# Patient Record
Sex: Male | Born: 1970
Health system: Southern US, Community
[De-identification: ages and names within clinical notes are randomized; demographics above are authoritative.]

## PROBLEM LIST (undated history)

## (undated) DIAGNOSIS — E785 Hyperlipidemia, unspecified: Secondary | ICD-10-CM

## (undated) DIAGNOSIS — I10 Essential (primary) hypertension: Secondary | ICD-10-CM

## (undated) DIAGNOSIS — K219 Gastro-esophageal reflux disease without esophagitis: Secondary | ICD-10-CM

## (undated) HISTORY — DX: Gastro-esophageal reflux disease without esophagitis: K21.9

## (undated) HISTORY — DX: Hyperlipidemia, unspecified: E78.5

## (undated) HISTORY — DX: Essential (primary) hypertension: I10

## (undated) HISTORY — PX: SPINE SURGERY: SHX786

---

## 2006-05-05 ENCOUNTER — Emergency Department: Payer: Self-pay | Admitting: Emergency Medicine

## 2006-05-05 ENCOUNTER — Other Ambulatory Visit: Payer: Self-pay

## 2006-11-02 ENCOUNTER — Encounter: Payer: Self-pay | Admitting: Family Medicine

## 2007-02-17 HISTORY — PX: BACK SURGERY: SHX140

## 2007-06-13 ENCOUNTER — Encounter: Payer: Self-pay | Admitting: Family Medicine

## 2007-06-21 ENCOUNTER — Encounter: Payer: Self-pay | Admitting: Family Medicine

## 2007-06-23 ENCOUNTER — Encounter: Payer: Self-pay | Admitting: Family Medicine

## 2008-02-15 ENCOUNTER — Encounter: Payer: Self-pay | Admitting: Family Medicine

## 2008-09-12 ENCOUNTER — Encounter: Payer: Self-pay | Admitting: Family Medicine

## 2009-07-09 ENCOUNTER — Emergency Department (HOSPITAL_COMMUNITY): Admission: EM | Admit: 2009-07-09 | Discharge: 2009-07-09 | Payer: Self-pay | Admitting: Emergency Medicine

## 2009-07-12 ENCOUNTER — Encounter: Payer: Self-pay | Admitting: Family Medicine

## 2010-01-21 ENCOUNTER — Ambulatory Visit: Payer: Self-pay | Admitting: Family Medicine

## 2010-01-21 DIAGNOSIS — I10 Essential (primary) hypertension: Secondary | ICD-10-CM | POA: Insufficient documentation

## 2010-01-21 DIAGNOSIS — E1165 Type 2 diabetes mellitus with hyperglycemia: Secondary | ICD-10-CM | POA: Insufficient documentation

## 2010-01-21 DIAGNOSIS — K219 Gastro-esophageal reflux disease without esophagitis: Secondary | ICD-10-CM | POA: Insufficient documentation

## 2010-01-21 DIAGNOSIS — E785 Hyperlipidemia, unspecified: Secondary | ICD-10-CM | POA: Insufficient documentation

## 2010-01-21 DIAGNOSIS — E1129 Type 2 diabetes mellitus with other diabetic kidney complication: Secondary | ICD-10-CM | POA: Insufficient documentation

## 2010-01-21 LAB — HM DIABETES FOOT EXAM

## 2010-01-23 ENCOUNTER — Encounter: Payer: Self-pay | Admitting: Family Medicine

## 2010-01-23 LAB — CONVERTED CEMR LAB
ALT: 22 units/L (ref 0–53)
AST: 18 units/L (ref 0–37)
Albumin: 4.7 g/dL (ref 3.5–5.2)
Alkaline Phosphatase: 71 units/L (ref 39–117)
BUN: 14 mg/dL (ref 6–23)
Bilirubin, Direct: 0.1 mg/dL (ref 0.0–0.3)
CO2: 29 meq/L (ref 19–32)
Calcium: 10 mg/dL (ref 8.4–10.5)
Chloride: 97 meq/L (ref 96–112)
Cholesterol: 211 mg/dL — ABNORMAL HIGH (ref 0–200)
Creatinine, Ser: 0.9 mg/dL (ref 0.4–1.5)
Direct LDL: 119.4 mg/dL
GFR calc non Af Amer: 94.99 mL/min (ref >60.00)
Glucose, Bld: 227 mg/dL — ABNORMAL HIGH (ref 70–99)
HDL: 38.5 mg/dL — ABNORMAL LOW (ref >39.00)
Hgb A1c MFr Bld: 8.5 % — ABNORMAL HIGH (ref 4.6–6.5)
Potassium: 4.6 meq/L (ref 3.5–5.1)
Sodium: 137 meq/L (ref 135–145)
Total Bilirubin: 0.6 mg/dL (ref 0.3–1.2)
Total CHOL/HDL Ratio: 5
Total Protein: 8 g/dL (ref 6.0–8.3)
Triglycerides: 299 mg/dL — ABNORMAL HIGH (ref 0.0–149.0)
VLDL: 59.8 mg/dL — ABNORMAL HIGH (ref 0.0–40.0)

## 2010-02-03 ENCOUNTER — Encounter: Payer: Self-pay | Admitting: Family Medicine

## 2010-02-03 LAB — CONVERTED CEMR LAB
Cholesterol: 208 mg/dL
Creatinine, Ser: 0.8 mg/dL
Direct LDL: 113 mg/dL
HDL: 42 mg/dL
Hgb A1c MFr Bld: 7.4 %
Total CHOL/HDL Ratio: 4.95
Triglycerides: 278 mg/dL

## 2010-02-12 ENCOUNTER — Encounter: Payer: Self-pay | Admitting: Family Medicine

## 2010-03-18 NOTE — Miscellaneous (Signed)
  Clinical Lists Changes  Medications: Changed medication from METFORMIN HCL 500 MG TABS (METFORMIN HCL) Take 1 tablet by mouth once daily to METFORMIN HCL 500 MG TABS (METFORMIN HCL) Take 1 tablet by mouth two times a day for 1 week, then 2 tablets two times a day. - Signed Rx of METFORMIN HCL 500 MG TABS (METFORMIN HCL) Take 1 tablet by mouth two times a day for 1 week, then 2 tablets two times a day.;  #120 x 3;  Signed;  Entered by: Delilah Shan CMA (AAMA);  Authorized by: Crawford Givens MD;  Method used: Electronically to [facility] [phone]*, [address] [place], [place]  [address], Ph: [phone], Fax:    Prescriptions: METFORMIN HCL 500 MG TABS (METFORMIN HCL) Take 1 tablet by mouth two times a day for 1 week, then 2 tablets two times a day.  #120 x 3   Entered by:   Delilah Shan CMA (AAMA)   Authorized by:   Crawford Givens MD   Signed by:   Delilah Shan CMA (AAMA) on 01/23/2010   Method used:   Electronically to        [facility] [phone]* (retail)       [address]       [place], [place]  [address]       Ph: [phone]       Fax:    RxID:   [id number]

## 2010-03-18 NOTE — Assessment & Plan Note (Signed)
Summary: TRANSFER FROM [facility]   Vital Signs:  Patient profile:   40 year old male Height:      71.75 inches Weight:      220.25 pounds BMI:     30.19 Temp:     98.4 degrees F oral Pulse rate:   80 / minute Pulse rhythm:   regular BP sitting:   128 / 82  (left arm) Cuff size:   large  Vitals Entered By: Delilah Shan CMA Duncan Dull) (January 21, 2010 9:07 AM) CC: Transfer from [place]   History of Present Illness: Diabetes:  Using medications without difficulties:yes Hypoglycemic episodes:not checked Hyperglycemic episodes:not checked Feet problems:no Blood Sugars averaging:not checked eye exam within last year: not yet, enouraged  Elevated Cholesterol: Using medications without problems:yes Muscle aches: no Other complaints:no  Hypertension:      Using medication without problems or lightheadedness: yes Chest pain with exertion:no Edema:no Short of breath:no Average home BPs:not checked Other issues: no  due for labs.   Preventive Screening-Counseling & Management  Alcohol-Tobacco     Smoking Status: never  Caffeine-Diet-Exercise     Does Patient Exercise: yes      Drug Use:  no.    Current Medications (verified): 1)  Lisinopril 20 Mg Tabs (Lisinopril) .... Take 1 Tablet By Mouth Once A Day 2)  Omeprazole 20 Mg Cpdr (Omeprazole) .... Take 1 Tablet By Mouth Once A Day 3)  Metformin Hcl 500 Mg Tabs (Metformin Hcl) .... Take 1 Tablet By Mouth Once Daily 4)  Simvastatin 40 Mg Tabs (Simvastatin) .... Take 1 Tab By Mouth At Bedtime  Allergies (verified): No Known Drug Allergies  Past History:  Past Medical History: HYPERTENSION (ICD-401.9) HYPERLIPIDEMIA (ICD-272.4) GERD (ICD-530.81) DIABETES MELLITUS, TYPE II (ICD-250.00)    Past Surgical History: 2009  Back surgery - herniated disk  Family History: Reviewed history and no changes required. Family History Diabetes 1st degree relative, parents Family History High cholesterol,  parents Family History Hypertension, parents F  alive, DM2 M alive HTN, DM2, chol 2  brothers alive  Social History: Reviewed history and no changes required. Occupation:  Oncologist since 1992 Education:  [employer], [employer] Never Smoked Alcohol use-yes, rare Drug use-no Regular exercise-yes, walks postal route married 1996, 1 daughterOccupation:  employed Smoking Status:  never Drug Use:  no Does Patient Exercise:  yes  Review of Systems       See HPI.  Otherwise negative.    Physical Exam  General:  GEN: nad, alert and oriented HEENT: mucous membranes moist NECK: supple w/o LA CV: rrr.  no murmur PULM: ctab, no inc wob ABD: soft, +bs EXT: no edema SKIN: no acute rash   Diabetes Management Exam:    Foot Exam (with socks and/or shoes not present):       Sensory-Pinprick/Light touch:          Left medial foot (L-4): normal          Left dorsal foot (L-5): normal          Left lateral foot (S-1): normal          Right medial foot (L-4): normal          Right dorsal foot (L-5): normal          Right lateral foot (S-1): normal       Sensory-Monofilament:          Left foot: normal          Right foot: normal  Inspection:          Left foot: normal          Right foot: normal       Nails:          Left foot: normal          Right foot: normal   Impression & Recommendations:  Problem # 1:  HYPERTENSION (ICD-401.9) Controlled, no change in meds.  Check labs.  His updated medication list for this problem includes:    Lisinopril 20 Mg Tabs (Lisinopril) .Marland Kitchen... Take 1 tablet by mouth once a day  Problem # 2:  HYPERLIPIDEMIA (ICD-272.4) See notes on labs.  His updated medication list for this problem includes:    Simvastatin 40 Mg Tabs (Simvastatin) .Marland Kitchen... Take 1 tab by mouth at bedtime  Problem # 3:  DIABETES MELLITUS, TYPE II (ICD-250.00) See notes on labs.  We talked about diet, exercise and weight.  His updated medication list for this  problem includes:    Lisinopril 20 Mg Tabs (Lisinopril) .Marland Kitchen... Take 1 tablet by mouth once a day    Metformin Hcl 500 Mg Tabs (Metformin hcl) .Marland Kitchen... Take 1 tablet by mouth once daily  Orders: TLB-BMP (Basic Metabolic Panel-BMET) (80048-METABOL) TLB-Hepatic/Liver Function Pnl (80076-HEPATIC) TLB-Lipid Panel (80061-LIPID) TLB-A1C / Hgb A1C (Glycohemoglobin) (83036-A1C)  Complete Medication List: 1)  Lisinopril 20 Mg Tabs (Lisinopril) .... Take 1 tablet by mouth once a day 2)  Omeprazole 20 Mg Cpdr (Omeprazole) .... Take 1 tablet by mouth once a day 3)  Metformin Hcl 500 Mg Tabs (Metformin hcl) .... Take 1 tablet by mouth once daily 4)  Simvastatin 40 Mg Tabs (Simvastatin) .... Take 1 tab by mouth at bedtime  Patient Instructions: 1)  You can get your results through our phone system.  Follow the instructions on the blue card.  2)  Don't change your meds in the meantime.  Keep working on your weight.  3)  I want you to come back in 6 months for a 30 min appointment with A1c ahead of time (250.00). 4)  Take care.  Glad to see you today.  Prescriptions: SIMVASTATIN 40 MG TABS (SIMVASTATIN) Take 1 tab by mouth at bedtime  #90 x 3   Entered and Authorized by:   Crawford Givens MD   Signed by:   Crawford Givens MD on 01/21/2010   Method used:   Electronically to        [facility] [phone]* (retail)       [address]       [place], [place]  [address]       Ph: [phone]       Fax:    RxID:   [id number] METFORMIN HCL 500 MG TABS (METFORMIN HCL) Take 1 tablet by mouth once daily  #90 x 3   Entered and Authorized by:   Crawford Givens MD   Signed by:   Crawford Givens MD on 01/21/2010   Method used:   Electronically to        [facility] [phone]* (retail)       [address]       [place], [place]  [address]       Ph: [phone]       Fax:    RxID:   [id number] LISINOPRIL 20 MG TABS (LISINOPRIL) Take 1 tablet by mouth once a day  #90 x 3   Entered and Authorized by:   Crawford Givens MD    Signed by:   Cheree Ditto  Para March MD on 01/21/2010   Method used:   Electronically to        [facility] [phone]* (retail)       [address]       [place], [place]  [address]       Ph: [phone]       Fax:    RxID:   [id number]    Orders Added: 1)  Est. Patient Level IV [62952] 2)  TLB-BMP (Basic Metabolic Panel-BMET) [80048-METABOL] 3)  TLB-Hepatic/Liver Function Pnl [80076-HEPATIC] 4)  TLB-Lipid Panel [80061-LIPID] 5)  TLB-A1C / Hgb A1C (Glycohemoglobin) [83036-A1C]    Current Allergies (reviewed today): No known allergies  Appended Document: TRANSFER FROM [facility] Flu Vaccine Consent Questions     Do you have a history of severe allergic reactions to this vaccine? no    Any prior history of allergic reactions to egg and/or gelatin? no    Do you have a sensitivity to the preservative Thimersol? no    Do you have a past history of Guillan-Barre Syndrome? no    Do you currently have an acute febrile illness? no    Have you ever had a severe reaction to latex? no    Vaccine information given and explained to patient? yes    Are you currently pregnant? no    Lot Number:[id number]   Exp Date:08/16/2010   Site Given  Left Deltoid IM Lugene Fuquay CMA (AAMA)  January 21, 2010 10:50 AM

## 2010-03-20 LAB — CONVERTED CEMR LAB
ALT: 20 units/L
AST: 18 units/L
Albumin: 4.2 g/dL
Alkaline Phosphatase: 60 units/L
BUN: 12 mg/dL
Calcium: 9.5 mg/dL
Chloride: 101 meq/L
Cholesterol: 208 mg/dL
Creatinine, Ser: 0.8 mg/dL
Direct LDL: 113 mg/dL
Glucose, Bld: 138 mg/dL
HDL: 42 mg/dL
Hemoglobin: 17.4 g/dL
Hgb A1c MFr Bld: 6.5 %
Potassium: 4.5 meq/L
Sodium: 137 meq/L
Total Bilirubin: 0.7 mg/dL
Total CHOL/HDL Ratio: 4.95
Total Protein: 6.7 g/dL
Triglycerides: 278 mg/dL

## 2010-03-20 NOTE — Miscellaneous (Signed)
  Clinical Lists Changes  Observations: Added new observation of HGB: 17.4 g/dL (04/54/0981 19:14) Added new observation of HGBA1C: 6.5 % (02/15/2008 16:48) Added new observation of CALCIUM: 9.5 mg/dL (78/29/5621 30:86) Added new observation of ALBUMIN: 4.2 g/dL (57/84/6962 95:28) Added new observation of PROTEIN, TOT: 6.7 g/dL (41/32/4401 02:72) Added new observation of SGPT (ALT): 20 units/L (02/15/2008 16:48) Added new observation of SGOT (AST): 18 units/L (02/15/2008 16:48) Added new observation of ALK PHOS: 60 units/L (02/15/2008 16:48) Added new observation of BILI TOTAL: 0.7 mg/dL (53/66/4403 47:42) Added new observation of CREATININE: 0.80 mg/dL (59/56/3875 64:33) Added new observation of BUN: 12 mg/dL (29/51/8841 66:06) Added new observation of BG RANDOM: 138 mg/dL (30/16/0109 32:35) Added new observation of CL SERUM: 101 meq/L (02/15/2008 16:48) Added new observation of K SERUM: 4.5 meq/L (02/15/2008 16:48) Added new observation of NA: 137 meq/L (02/15/2008 16:48) Added new observation of LDL DIR: 113 mg/dL (57/32/2025 42:70) Added new observation of CHOL/HDL: 4.95  (02/15/2008 16:48) Added new observation of HDL: 42 mg/dL (62/37/6283 15:17) Added new observation of TRIGLYC TOT: 278 mg/dL (61/60/7371 06:26) Added new observation of CHOLESTEROL: 208 mg/dL (94/85/4627 03:50)

## 2010-03-20 NOTE — Letter (Signed)
Summary: Deboraha Sprang @ [facility] @ [employer]   Imported By: Lanelle Bal 02/11/2010 11:45:11  _____________________________________________________________________  External Attachment:    Type:   Image     Comment:   External Document

## 2010-03-20 NOTE — Letter (Signed)
Summary: Deboraha Sprang @ [facility] @ [employer]   Imported By: Lanelle Bal 02/11/2010 11:47:41  _____________________________________________________________________  External Attachment:    Type:   Image     Comment:   External Document

## 2010-03-20 NOTE — Letter (Signed)
Summary: Harry Whitaker @ [facility] @ [employer]   Imported By: Lanelle Bal 02/11/2010 11:43:26  _____________________________________________________________________  External Attachment:    Type:   Image     Comment:   External Document

## 2010-03-20 NOTE — Letter (Signed)
Summary: Harry Whitaker @ [facility] @ [employer]   Imported By: Lanelle Bal 02/11/2010 11:46:10  _____________________________________________________________________  External Attachment:    Type:   Image     Comment:   External Document

## 2010-03-20 NOTE — Miscellaneous (Signed)
Summary: Preload  Clinical Lists Changes  Observations: Added new observation of CHOL/HDL: 4.95  (02/03/2010 10:47) Added new observation of LDL DIR: 113 mg/dL (16/11/9602 54:09) Added new observation of HDL: 42 mg/dL (81/19/1478 29:56) Added new observation of TRIGLYC TOT: 278 mg/dL (21/30/8657 84:69) Added new observation of CHOLESTEROL: 208 mg/dL (62/95/2841 32:44) Added new observation of CREATININE: 0.80 mg/dL (02/18/7251 66:44) Added new observation of HGBA1C: 7.4 % (02/03/2010 10:47)

## 2010-03-20 NOTE — Letter (Signed)
Summary: Harry Whitaker @ [facility] @ [employer]   Imported By: Harry Whitaker 02/11/2010 11:44:26  _____________________________________________________________________  External Attachment:    Type:   Image     Comment:   External Document

## 2010-03-20 NOTE — Letter (Signed)
Summary: Harry Whitaker @ [facility] @ [employer]   Imported By: Lanelle Bal 02/11/2010 11:46:58  _____________________________________________________________________  External Attachment:    Type:   Image     Comment:   External Document

## 2010-04-30 ENCOUNTER — Other Ambulatory Visit: Payer: Self-pay | Admitting: Family Medicine

## 2010-04-30 ENCOUNTER — Other Ambulatory Visit (INDEPENDENT_AMBULATORY_CARE_PROVIDER_SITE_OTHER): Payer: Federal, State, Local not specified - PPO

## 2010-04-30 ENCOUNTER — Encounter: Payer: Self-pay | Admitting: *Deleted

## 2010-04-30 DIAGNOSIS — E119 Type 2 diabetes mellitus without complications: Secondary | ICD-10-CM

## 2010-04-30 LAB — HEMOGLOBIN A1C: Hgb A1c MFr Bld: 8.3 % — ABNORMAL HIGH (ref 4.6–6.5)

## 2010-05-08 ENCOUNTER — Encounter: Payer: Self-pay | Admitting: Family Medicine

## 2010-05-09 ENCOUNTER — Encounter: Payer: Self-pay | Admitting: Family Medicine

## 2010-05-09 ENCOUNTER — Ambulatory Visit (INDEPENDENT_AMBULATORY_CARE_PROVIDER_SITE_OTHER): Payer: Federal, State, Local not specified - PPO | Admitting: Family Medicine

## 2010-05-09 VITALS — BP 122/78 | HR 80 | Temp 98.2°F | Ht 72.0 in | Wt 223.1 lb

## 2010-05-09 DIAGNOSIS — E119 Type 2 diabetes mellitus without complications: Secondary | ICD-10-CM

## 2010-05-09 NOTE — Patient Instructions (Addendum)
Increase the metformin to 4 tabs a day.  Let me know if you have GI upset on the medicine.   Schedule a follow up appointment in: 3 months (a few days after your labs are done).  Schedule both on the way out today.  Work on [employer] and keep exercising.  Take care.

## 2010-05-09 NOTE — Assessment & Plan Note (Signed)
Inc metformin to 2 po bid and recheck A1c in 3 months with fu OV a few days later.  D/w pt ZO:XWRU and exercise.  He declined cards work up at this point.  I asked him to consider this.  No CP.

## 2010-05-09 NOTE — Progress Notes (Signed)
Diabetes:  Using medications without difficulties:yes but only taking the metformin 2 at night, not bid Hypoglycemic episodes: Not checked but no symptoms.  Hyperglycemic episodes:Not checked but no symptoms.  Feet problems:no Blood Sugars averaging: Not checked  PMH and SH reviewed  Meds, vitals, and allergies reviewed.   ROS: See HPI.  Otherwise negative.    GEN: nad, alert and oriented HEENT: mucous membranes moist NECK: supple w/o LA CV: rrr. PULM: ctab, no inc wob ABD: soft, +bs EXT: no edema SKIN: no acute rash  Diabetic foot exam: Normal inspection No skin breakdown No calluses  Normal DP pulses Normal sensation to light tough and monofilament Nails normal

## 2010-08-01 ENCOUNTER — Other Ambulatory Visit: Payer: Federal, State, Local not specified - PPO

## 2010-08-11 ENCOUNTER — Ambulatory Visit: Payer: Federal, State, Local not specified - PPO | Admitting: Family Medicine

## 2010-08-11 DIAGNOSIS — Z0289 Encounter for other administrative examinations: Secondary | ICD-10-CM

## 2010-09-03 ENCOUNTER — Other Ambulatory Visit: Payer: Self-pay | Admitting: *Deleted

## 2010-09-03 MED ORDER — METFORMIN HCL 500 MG PO TABS: ORAL_TABLET | ORAL | Status: DC

## 2010-10-23 ENCOUNTER — Other Ambulatory Visit (INDEPENDENT_AMBULATORY_CARE_PROVIDER_SITE_OTHER): Payer: Federal, State, Local not specified - PPO

## 2010-10-23 ENCOUNTER — Telehealth: Payer: Self-pay | Admitting: *Deleted

## 2010-10-23 DIAGNOSIS — E119 Type 2 diabetes mellitus without complications: Secondary | ICD-10-CM

## 2010-10-23 LAB — HEMOGLOBIN A1C: Hgb A1c MFr Bld: 7.7 % — ABNORMAL HIGH (ref 4.6–6.5)

## 2010-10-23 NOTE — Telephone Encounter (Signed)
Chart opened in error

## 2010-10-24 ENCOUNTER — Other Ambulatory Visit: Payer: Federal, State, Local not specified - PPO

## 2010-10-29 ENCOUNTER — Ambulatory Visit (INDEPENDENT_AMBULATORY_CARE_PROVIDER_SITE_OTHER): Payer: Federal, State, Local not specified - PPO | Admitting: Family Medicine

## 2010-10-29 ENCOUNTER — Encounter: Payer: Self-pay | Admitting: Family Medicine

## 2010-10-29 VITALS — BP 136/68 | HR 92 | Temp 97.8°F | Wt 223.0 lb

## 2010-10-29 DIAGNOSIS — E119 Type 2 diabetes mellitus without complications: Secondary | ICD-10-CM

## 2010-10-29 MED ORDER — SIMVASTATIN 40 MG PO TABS: 40.0000 mg | ORAL_TABLET | Freq: Every day | ORAL | Status: DC

## 2010-10-29 MED ORDER — LISINOPRIL 20 MG PO TABS: 20.0000 mg | ORAL_TABLET | Freq: Every day | ORAL | Status: DC

## 2010-10-29 MED ORDER — OMEPRAZOLE 20 MG PO CPDR: 20.0000 mg | DELAYED_RELEASE_CAPSULE | Freq: Every day | ORAL | Status: DC

## 2010-10-29 MED ORDER — METFORMIN HCL 500 MG PO TABS: ORAL_TABLET | ORAL | Status: DC

## 2010-10-29 NOTE — Progress Notes (Signed)
Diabetes:  Using medications without difficulties: max 2 metformin a day Hypoglycemic episodes:no Hyperglycemic episodes:no Feet problems: callus now on the new route Blood Sugars averaging: not checked A1c is some better at 7.7 He cut out soda, now on sugar free drinks.  "I cut back a little bit." New route at work, more walking.  Started 2 days ago.    PMH and SH reviewed  Meds, vitals, and allergies reviewed.   ROS: See HPI.  Otherwise negative.    GEN: nad, alert and oriented HEENT: mucous membranes moist NECK: supple w/o LA CV: rrr. PULM: ctab, no inc wob ABD: soft, +bs EXT: no edema SKIN: no acute rash  Diabetic foot exam: Normal inspection No skin breakdown Callus on R 4th toe Normal DP pulses Normal sensation to light touch and monofilament Nails normal

## 2010-10-29 NOTE — Patient Instructions (Addendum)
I would get a flu shot each fall.   Recheck fasting labs in 6 months before a OV.  Take care.

## 2010-10-30 ENCOUNTER — Encounter: Payer: Self-pay | Admitting: Family Medicine

## 2010-10-30 NOTE — Assessment & Plan Note (Signed)
He'll work on weight and get comfortable shoes.  I wrote an rx for this, given the callus.  Rx written for meter, in case he can't get his to work.  His DM is still uncontrolled, but improved from prev.  D/w pt about this.  Recheck 6 months.  He agrees.  No sign of infection at the callus site.

## 2011-01-28 ENCOUNTER — Other Ambulatory Visit: Payer: Self-pay | Admitting: Family Medicine

## 2011-01-28 NOTE — Telephone Encounter (Signed)
Patient was seen 10/29/10 but notation on medication refill request says he must have an OV for further refills.  Please advise.

## 2011-01-28 NOTE — Telephone Encounter (Signed)
rx sent, sig adjusted.

## 2011-03-27 ENCOUNTER — Other Ambulatory Visit: Payer: Self-pay | Admitting: Family Medicine

## 2011-12-19 ENCOUNTER — Other Ambulatory Visit: Payer: Self-pay | Admitting: Family Medicine

## 2011-12-21 ENCOUNTER — Other Ambulatory Visit: Payer: Self-pay | Admitting: Family Medicine

## 2011-12-30 ENCOUNTER — Other Ambulatory Visit: Payer: Self-pay | Admitting: Family Medicine

## 2012-01-05 ENCOUNTER — Ambulatory Visit (INDEPENDENT_AMBULATORY_CARE_PROVIDER_SITE_OTHER): Payer: Federal, State, Local not specified - PPO | Admitting: Family Medicine

## 2012-01-05 ENCOUNTER — Encounter: Payer: Self-pay | Admitting: Family Medicine

## 2012-01-05 VITALS — BP 122/80 | HR 98 | Temp 98.0°F | Wt 216.0 lb

## 2012-01-05 DIAGNOSIS — Z0000 Encounter for general adult medical examination without abnormal findings: Secondary | ICD-10-CM

## 2012-01-05 DIAGNOSIS — E119 Type 2 diabetes mellitus without complications: Secondary | ICD-10-CM

## 2012-01-05 DIAGNOSIS — E785 Hyperlipidemia, unspecified: Secondary | ICD-10-CM

## 2012-01-05 DIAGNOSIS — I10 Essential (primary) hypertension: Secondary | ICD-10-CM

## 2012-01-05 LAB — LDL CHOLESTEROL, DIRECT: Direct LDL: 145.4 mg/dL

## 2012-01-05 LAB — COMPREHENSIVE METABOLIC PANEL
ALT: 27 U/L (ref 0–53)
AST: 17 U/L (ref 0–37)
Albumin: 4.6 g/dL (ref 3.5–5.2)
Alkaline Phosphatase: 74 U/L (ref 39–117)
BUN: 18 mg/dL (ref 6–23)
CO2: 28 mEq/L (ref 19–32)
Calcium: 9.6 mg/dL (ref 8.4–10.5)
Chloride: 94 mEq/L — ABNORMAL LOW (ref 96–112)
Creatinine, Ser: 1 mg/dL (ref 0.4–1.5)
GFR: 82.72 mL/min (ref >60.00)
Glucose, Bld: 287 mg/dL — ABNORMAL HIGH (ref 70–99)
Potassium: 4.5 mEq/L (ref 3.5–5.1)
Sodium: 132 mEq/L — ABNORMAL LOW (ref 135–145)
Total Bilirubin: 0.6 mg/dL (ref 0.3–1.2)
Total Protein: 8.3 g/dL (ref 6.0–8.3)

## 2012-01-05 LAB — LIPID PANEL
Cholesterol: 265 mg/dL — ABNORMAL HIGH (ref 0–200)
HDL: 41.6 mg/dL (ref >39.00)
Total CHOL/HDL Ratio: 6
Triglycerides: 488 mg/dL — ABNORMAL HIGH (ref 0.0–149.0)
VLDL: 97.6 mg/dL — ABNORMAL HIGH (ref 0.0–40.0)

## 2012-01-05 LAB — HEMOGLOBIN A1C: Hgb A1c MFr Bld: 9 % — ABNORMAL HIGH (ref 4.6–6.5)

## 2012-01-05 MED ORDER — LISINOPRIL 20 MG PO TABS: 20.0000 mg | ORAL_TABLET | Freq: Every day | ORAL | Status: DC

## 2012-01-05 MED ORDER — OMEPRAZOLE 20 MG PO CPDR: 20.0000 mg | DELAYED_RELEASE_CAPSULE | Freq: Every day | ORAL | Status: DC

## 2012-01-05 MED ORDER — METFORMIN HCL 1000 MG PO TABS: 1000.0000 mg | ORAL_TABLET | Freq: Every day | ORAL | Status: DC

## 2012-01-05 MED ORDER — SIMVASTATIN 40 MG PO TABS: 40.0000 mg | ORAL_TABLET | Freq: Every day | ORAL | Status: DC

## 2012-01-05 NOTE — Patient Instructions (Addendum)
Go to the lab on the way out.  We'll contact you with your lab report.  Don't change your meds for now.  Keep working on your weight with diet and exercise.  Take care.   Recheck in 6 months.

## 2012-01-05 NOTE — Progress Notes (Signed)
CPE- See plan.  Routine anticipatory guidance given to patient.  See health maintenance. Tetanus 2011 Flu yearly, done at work Colon and prostate cancer screening not due.  Exercise discussed. Walking some.   Living will discussed.  Wife would be designated if incapacitated.    Diabetes:  Using medications without difficulties: yes, if 1000mg  or less of metformin Hypoglycemic episodes:no symptoms Hyperglycemic episodes:no symptoms Feet problems: no Blood Sugars averaging: not checked eye exam within last year: d/w pt, encouraged f/u.   Hypertension:    Using medication without problems or lightheadedness: yes Chest pain with exertion:no Edema:no Short of breath:no  Elevated Cholesterol: Using medications without problems:yes Muscle aches: no Diet compliance: yes Exercise: yes  PMH and SH reviewed.   Vital signs, Meds and allergies reviewed.  ROS: See HPI.  Otherwise nontributory.   GEN: nad, alert and oriented HEENT: mucous membranes moist NECK: supple w/o LA CV: rrr.  no murmur PULM: ctab, no inc wob ABD: soft, +bs EXT: no edema SKIN: no acute rash  Diabetic foot exam: Normal inspection No skin breakdown No calluses  Normal DP pulses Normal sensation to light tough and monofilament Nails normal

## 2012-01-07 DIAGNOSIS — Z0000 Encounter for general adult medical examination without abnormal findings: Secondary | ICD-10-CM | POA: Insufficient documentation

## 2012-01-07 MED ORDER — PIOGLITAZONE HCL 15 MG PO TABS: 15.0000 mg | ORAL_TABLET | Freq: Every day | ORAL | Status: DC

## 2012-01-07 NOTE — Assessment & Plan Note (Signed)
Elevated, continue statin and work on DM2 control first.  Recheck in 3 months.

## 2012-01-07 NOTE — Assessment & Plan Note (Signed)
Controlled, continue as is.  

## 2012-01-07 NOTE — Assessment & Plan Note (Signed)
Elevated A1c, would add on actos and have pt recheck A1c in 3 months before OV.

## 2012-01-07 NOTE — Assessment & Plan Note (Signed)
Routine anticipatory guidance given to patient.  See health maintenance. Tetanus 2011 Flu yearly, done at work Colon and prostate cancer screening not due.  Exercise discussed. Walking some.   Living will discussed.  Wife would be designated if incapacitated.

## 2012-01-12 ENCOUNTER — Encounter: Payer: Self-pay | Admitting: *Deleted

## 2012-01-22 ENCOUNTER — Other Ambulatory Visit: Payer: Self-pay | Admitting: Family Medicine

## 2012-04-06 ENCOUNTER — Other Ambulatory Visit: Payer: Federal, State, Local not specified - PPO

## 2012-04-12 ENCOUNTER — Other Ambulatory Visit: Payer: Federal, State, Local not specified - PPO

## 2012-04-13 ENCOUNTER — Ambulatory Visit: Payer: Federal, State, Local not specified - PPO | Admitting: Family Medicine

## 2012-04-13 ENCOUNTER — Other Ambulatory Visit (INDEPENDENT_AMBULATORY_CARE_PROVIDER_SITE_OTHER): Payer: Federal, State, Local not specified - PPO

## 2012-04-13 DIAGNOSIS — E119 Type 2 diabetes mellitus without complications: Secondary | ICD-10-CM

## 2012-04-13 LAB — LIPID PANEL
Cholesterol: 192 mg/dL (ref 0–200)
HDL: 35.3 mg/dL — ABNORMAL LOW (ref >39.00)
Total CHOL/HDL Ratio: 5
Triglycerides: 338 mg/dL — ABNORMAL HIGH (ref 0.0–149.0)
VLDL: 67.6 mg/dL — ABNORMAL HIGH (ref 0.0–40.0)

## 2012-04-13 LAB — LDL CHOLESTEROL, DIRECT: Direct LDL: 104 mg/dL

## 2012-04-13 LAB — HEMOGLOBIN A1C: Hgb A1c MFr Bld: 8.5 % — ABNORMAL HIGH (ref 4.6–6.5)

## 2012-04-21 ENCOUNTER — Ambulatory Visit: Payer: Federal, State, Local not specified - PPO | Admitting: Family Medicine

## 2012-12-13 ENCOUNTER — Encounter: Payer: Self-pay | Admitting: Family Medicine

## 2012-12-13 ENCOUNTER — Ambulatory Visit (INDEPENDENT_AMBULATORY_CARE_PROVIDER_SITE_OTHER): Payer: Federal, State, Local not specified - PPO | Admitting: Family Medicine

## 2012-12-13 VITALS — BP 130/80 | HR 86 | Temp 98.0°F | Wt 218.5 lb

## 2012-12-13 DIAGNOSIS — I10 Essential (primary) hypertension: Secondary | ICD-10-CM

## 2012-12-13 DIAGNOSIS — E785 Hyperlipidemia, unspecified: Secondary | ICD-10-CM

## 2012-12-13 DIAGNOSIS — E119 Type 2 diabetes mellitus without complications: Secondary | ICD-10-CM

## 2012-12-13 LAB — COMPREHENSIVE METABOLIC PANEL
ALT: 26 U/L (ref 0–53)
AST: 22 U/L (ref 0–37)
Albumin: 4.5 g/dL (ref 3.5–5.2)
Alkaline Phosphatase: 67 U/L (ref 39–117)
BUN: 12 mg/dL (ref 6–23)
CO2: 27 mEq/L (ref 19–32)
Calcium: 9.3 mg/dL (ref 8.4–10.5)
Chloride: 96 mEq/L (ref 96–112)
Creatinine, Ser: 0.9 mg/dL (ref 0.4–1.5)
GFR: 93.7 mL/min (ref >60.00)
Glucose, Bld: 232 mg/dL — ABNORMAL HIGH (ref 70–99)
Potassium: 4.1 mEq/L (ref 3.5–5.1)
Sodium: 133 mEq/L — ABNORMAL LOW (ref 135–145)
Total Bilirubin: 0.8 mg/dL (ref 0.3–1.2)
Total Protein: 7.7 g/dL (ref 6.0–8.3)

## 2012-12-13 LAB — LIPID PANEL
Cholesterol: 207 mg/dL — ABNORMAL HIGH (ref 0–200)
HDL: 38.5 mg/dL — ABNORMAL LOW (ref >39.00)
Total CHOL/HDL Ratio: 5
Triglycerides: 443 mg/dL — ABNORMAL HIGH (ref 0.0–149.0)
VLDL: 88.6 mg/dL — ABNORMAL HIGH (ref 0.0–40.0)

## 2012-12-13 LAB — LDL CHOLESTEROL, DIRECT: Direct LDL: 105 mg/dL

## 2012-12-13 LAB — HEMOGLOBIN A1C: Hgb A1c MFr Bld: 10.1 % — ABNORMAL HIGH (ref 4.6–6.5)

## 2012-12-13 MED ORDER — OMEPRAZOLE 20 MG PO CPDR: 20.0000 mg | DELAYED_RELEASE_CAPSULE | Freq: Every day | ORAL | Status: DC

## 2012-12-13 MED ORDER — METFORMIN HCL 1000 MG PO TABS: 1000.0000 mg | ORAL_TABLET | Freq: Every day | ORAL | Status: DC

## 2012-12-13 MED ORDER — LISINOPRIL 20 MG PO TABS: 20.0000 mg | ORAL_TABLET | Freq: Every day | ORAL | Status: DC

## 2012-12-13 MED ORDER — PIOGLITAZONE HCL 15 MG PO TABS: 15.0000 mg | ORAL_TABLET | Freq: Every day | ORAL | Status: DC

## 2012-12-13 MED ORDER — SIMVASTATIN 40 MG PO TABS: 40.0000 mg | ORAL_TABLET | Freq: Every day | ORAL | Status: DC

## 2012-12-13 NOTE — Assessment & Plan Note (Signed)
Controlled, work on diet and weight, exercise.  Continue current meds.  See notes on labs.

## 2012-12-13 NOTE — Assessment & Plan Note (Signed)
Encouraged eye exam.  Work on diet and weight, exercise.  Continue current meds.  See notes on labs.

## 2012-12-13 NOTE — Assessment & Plan Note (Signed)
Work on diet and weight, exercise.  Continue current meds.  See notes on labs.

## 2012-12-13 NOTE — Patient Instructions (Addendum)
Go to the lab on the way out.  We'll contact you with your lab report. Get the flu shot at work.  Keep walking for exercise and keep working on your diet.   Take care.

## 2012-12-13 NOTE — Progress Notes (Signed)
Diabetes:  Using medications without difficulties: yes Hypoglycemic episodes: no sx Hyperglycemic episodes: no sx Feet problems:no Blood Sugars averaging: not checked eye exam within last year: due.   Hypertension:    Using medication without problems or lightheadedness: yes Chest pain with exertion:no Edema:no Short of breath:no  Elevated Cholesterol: Using medications without problems: yes Muscle aches: no Diet compliance: discussed, "not doing worse but not much better." Exercise: walking  PMH and SH reviewed  Vital signs, Meds and allergies reviewed.  ROS: See HPI.  Otherwise nontributory.   GEN: nad, alert and oriented HEENT: mucous membranes moist NECK: supple w/o LA CV: rrr.  no murmur PULM: ctab, no inc wob ABD: soft, +bs EXT: no edema SKIN: no acute rash  Diabetic foot exam: Normal inspection No skin breakdown No calluses  Normal DP pulses Normal sensation to light tough and monofilament Nails normal

## 2012-12-21 ENCOUNTER — Ambulatory Visit (INDEPENDENT_AMBULATORY_CARE_PROVIDER_SITE_OTHER): Payer: Federal, State, Local not specified - PPO | Admitting: Family Medicine

## 2012-12-21 ENCOUNTER — Encounter: Payer: Self-pay | Admitting: Family Medicine

## 2012-12-21 VITALS — BP 110/70 | HR 94 | Temp 98.2°F | Wt 215.0 lb

## 2012-12-21 DIAGNOSIS — IMO0001 Reserved for inherently not codable concepts without codable children: Secondary | ICD-10-CM

## 2012-12-21 DIAGNOSIS — E1165 Type 2 diabetes mellitus with hyperglycemia: Secondary | ICD-10-CM

## 2012-12-21 NOTE — Progress Notes (Signed)
"  Drastic changes since the phone call."  Hasn't checked his sugar.  Diet changes: grilled chicken and water now.  No soda or tea.  Cutting out hamburgers.   Discussed his labs.  A1c and TGs elevated.  I told him his numbers were "awful."   Meds, vitals, and allergies reviewed.   ROS: See HPI.  Otherwise, noncontributory.  nad Exam deferred.

## 2012-12-21 NOTE — Patient Instructions (Signed)
Check your sugar in the mornings before eating and update me next week.  Take care.  Glad to see you.

## 2012-12-22 NOTE — Assessment & Plan Note (Signed)
He'll start checking sugars and notify me in a few days.  Unless sig changes in sugar at home, he'll need insulin. Discussed. He wants to avoid more meds.  I hope this gets him moving the right direction.  >15 min spent with face to face with patient, >50% counseling and/or coordinating care

## 2012-12-29 ENCOUNTER — Encounter: Payer: Self-pay | Admitting: Family Medicine

## 2012-12-30 ENCOUNTER — Other Ambulatory Visit: Payer: Self-pay | Admitting: Family Medicine

## 2012-12-30 MED ORDER — PIOGLITAZONE HCL 15 MG PO TABS: 30.0000 mg | ORAL_TABLET | Freq: Every day | ORAL | Status: DC

## 2013-01-09 ENCOUNTER — Telehealth: Payer: Self-pay | Admitting: Family Medicine

## 2013-01-09 NOTE — Telephone Encounter (Signed)
Pt left vm requesting call back to give recent blood sugar results and to ask a question.  Called pt back at number given, unable to leave voicemail.  [phone].

## 2013-01-09 NOTE — Telephone Encounter (Signed)
Please see what information you can get. Thanks.

## 2013-01-09 NOTE — Telephone Encounter (Signed)
Left message on patient's voicemail to return call

## 2013-01-10 NOTE — Telephone Encounter (Signed)
Left message on patient's voicemail to return call

## 2013-01-16 ENCOUNTER — Encounter: Payer: Self-pay | Admitting: *Deleted

## 2013-01-16 NOTE — Telephone Encounter (Signed)
Letter mailed

## 2013-08-30 ENCOUNTER — Other Ambulatory Visit (INDEPENDENT_AMBULATORY_CARE_PROVIDER_SITE_OTHER): Payer: Federal, State, Local not specified - PPO

## 2013-08-30 ENCOUNTER — Other Ambulatory Visit: Payer: Self-pay | Admitting: Family Medicine

## 2013-08-30 DIAGNOSIS — E1165 Type 2 diabetes mellitus with hyperglycemia: Secondary | ICD-10-CM

## 2013-08-30 DIAGNOSIS — IMO0002 Reserved for concepts with insufficient information to code with codable children: Secondary | ICD-10-CM

## 2013-08-30 DIAGNOSIS — IMO0001 Reserved for inherently not codable concepts without codable children: Secondary | ICD-10-CM

## 2013-08-30 LAB — HEMOGLOBIN A1C: Hgb A1c MFr Bld: 7.7 % — ABNORMAL HIGH (ref 4.6–6.5)

## 2013-08-30 LAB — LDL CHOLESTEROL, DIRECT: Direct LDL: 116.2 mg/dL

## 2014-01-03 ENCOUNTER — Ambulatory Visit (INDEPENDENT_AMBULATORY_CARE_PROVIDER_SITE_OTHER): Payer: Federal, State, Local not specified - PPO | Admitting: Family Medicine

## 2014-01-03 ENCOUNTER — Encounter: Payer: Self-pay | Admitting: Family Medicine

## 2014-01-03 VITALS — BP 124/84 | HR 96 | Temp 98.6°F | Wt 218.5 lb

## 2014-01-03 DIAGNOSIS — K219 Gastro-esophageal reflux disease without esophagitis: Secondary | ICD-10-CM

## 2014-01-03 DIAGNOSIS — Z23 Encounter for immunization: Secondary | ICD-10-CM

## 2014-01-03 DIAGNOSIS — L989 Disorder of the skin and subcutaneous tissue, unspecified: Secondary | ICD-10-CM

## 2014-01-03 DIAGNOSIS — E1165 Type 2 diabetes mellitus with hyperglycemia: Secondary | ICD-10-CM

## 2014-01-03 DIAGNOSIS — IMO0002 Reserved for concepts with insufficient information to code with codable children: Secondary | ICD-10-CM

## 2014-01-03 DIAGNOSIS — E119 Type 2 diabetes mellitus without complications: Secondary | ICD-10-CM

## 2014-01-03 MED ORDER — LISINOPRIL 20 MG PO TABS: 20.0000 mg | ORAL_TABLET | Freq: Every day | ORAL | Status: DC

## 2014-01-03 MED ORDER — SIMVASTATIN 40 MG PO TABS: 40.0000 mg | ORAL_TABLET | Freq: Every day | ORAL | Status: DC

## 2014-01-03 MED ORDER — PIOGLITAZONE HCL 15 MG PO TABS
15.0000 mg | ORAL_TABLET | Freq: Every day | ORAL | Status: DC
Start: 2014-01-03 — End: 2014-01-03

## 2014-01-03 MED ORDER — PIOGLITAZONE HCL 15 MG PO TABS: 15.0000 mg | ORAL_TABLET | Freq: Every day | ORAL | Status: DC

## 2014-01-03 MED ORDER — OMEPRAZOLE 20 MG PO CPDR: 20.0000 mg | DELAYED_RELEASE_CAPSULE | Freq: Every day | ORAL | Status: DC

## 2014-01-03 MED ORDER — METFORMIN HCL 1000 MG PO TABS: 1000.0000 mg | ORAL_TABLET | Freq: Every day | ORAL | Status: DC

## 2014-01-03 NOTE — Progress Notes (Signed)
Pre visit review using our clinic review tool, if applicable. No additional management support is needed unless otherwise documented below in the visit note.  Diabetes:  Using medications without difficulties: yes Hypoglycemic episodes: no sx Hyperglycemic episodes: no sx Feet problems: no  Blood Sugars averaging: not checked.  eye exam within last year: due, encouraged.   We talked about actos.  At this point, there is no clear evidence to direct a change in meds (re: actos).  Med is tolerated (no h/o bladder CA and no h/o blood in urine) and benefit from control of DM2 outweighs other considerations.  Pt agrees to continue actos.   He cut back on soda.  Weight was down but regained some.  D/w pt.   Due for labs.    GERD to the point of regurgitation, usually controlled with meds, but will occ have pains and regurgitation, acidic taste in mouth.   He has continued PPI and usually does well.  We talked about keeping HOB elevated.    Meds, vitals, and allergies reviewed.   ROS: See HPI.  Otherwise negative.    GEN: nad, alert and oriented HEENT: mucous membranes moist NECK: supple w/o LA CV: rrr. PULM: ctab, no inc wob ABD: soft, +bs EXT: no edema SKIN: no acute rash but small warty lesion on medial L ankle.  D/w pt.  It gets irritated.    Diabetic foot exam: Normal inspection No skin breakdown No calluses  Normal DP pulses Normal sensation to light touch and monofilament Nails normal

## 2014-01-03 NOTE — Patient Instructions (Addendum)
Call about an eye exam, to check for diabetes in your eyes. Go to the lab on the way out.  We'll contact you with your lab report. Keep the area on your ankle clean and covered.  It  Should gradually heal over.  It may form a blister in the meantime.  Take care.  Glad to see you.  Keep working on your weight in the meantime.

## 2014-01-04 DIAGNOSIS — L989 Disorder of the skin and subcutaneous tissue, unspecified: Secondary | ICD-10-CM | POA: Insufficient documentation

## 2014-01-04 LAB — COMPREHENSIVE METABOLIC PANEL
ALT: 28 U/L (ref 0–53)
AST: 19 U/L (ref 0–37)
Albumin: 4.7 g/dL (ref 3.5–5.2)
Alkaline Phosphatase: 68 U/L (ref 39–117)
BUN: 15 mg/dL (ref 6–23)
CO2: 27 mEq/L (ref 19–32)
Calcium: 9.8 mg/dL (ref 8.4–10.5)
Chloride: 98 mEq/L (ref 96–112)
Creatinine, Ser: 1 mg/dL (ref 0.4–1.5)
GFR: 87.78 mL/min (ref >60.00)
Glucose, Bld: 195 mg/dL — ABNORMAL HIGH (ref 70–99)
Potassium: 4.2 mEq/L (ref 3.5–5.1)
Sodium: 135 mEq/L (ref 135–145)
Total Bilirubin: 0.9 mg/dL (ref 0.2–1.2)
Total Protein: 7.8 g/dL (ref 6.0–8.3)

## 2014-01-04 LAB — HEMOGLOBIN A1C: Hgb A1c MFr Bld: 9.7 % — ABNORMAL HIGH (ref 4.6–6.5)

## 2014-01-04 NOTE — Assessment & Plan Note (Signed)
GERD to the point of regurgitation, usually controlled with meds, but will occ have pains and regurgitation, acidic taste in mouth.  He has continued PPI and usually does well. We talked about keeping HOB elevated.  Continue PPI for now.

## 2014-01-04 NOTE — Assessment & Plan Note (Signed)
D/w pt about diet and exercise.  He had lost, then regained, weight.   See notes on labs.   No changes in meds at this point.   Encouraged an eye exam.

## 2014-01-04 NOTE — Assessment & Plan Note (Signed)
Warty lesion noted, d/w pt about options.  Only a few mm across.  Reasonable for liq N2 tx.  D/w pt.  He consents.  D/w pt about risk and benefits.  Frozen/thawed x3 with liq N2, tolerated well, no complications. Routine instructions given, f/u prn.  He agrees.

## 2014-01-09 ENCOUNTER — Encounter: Payer: Self-pay | Admitting: *Deleted

## 2014-03-14 ENCOUNTER — Ambulatory Visit (INDEPENDENT_AMBULATORY_CARE_PROVIDER_SITE_OTHER): Payer: Federal, State, Local not specified - PPO | Admitting: Family Medicine

## 2014-03-14 ENCOUNTER — Other Ambulatory Visit: Payer: Self-pay | Admitting: Family Medicine

## 2014-03-14 ENCOUNTER — Encounter: Payer: Self-pay | Admitting: Family Medicine

## 2014-03-14 VITALS — BP 120/80 | HR 100 | Temp 98.7°F | Wt 216.5 lb

## 2014-03-14 DIAGNOSIS — J069 Acute upper respiratory infection, unspecified: Secondary | ICD-10-CM | POA: Insufficient documentation

## 2014-03-14 MED ORDER — AZITHROMYCIN 250 MG PO TABS: ORAL_TABLET | ORAL | Status: DC

## 2014-03-14 MED ORDER — FLUTICASONE PROPIONATE 50 MCG/ACT NA SUSP: 2.0000 | Freq: Every day | NASAL | Status: DC

## 2014-03-14 MED ORDER — BENZONATATE 200 MG PO CAPS: 200.0000 mg | ORAL_CAPSULE | Freq: Three times a day (TID) | ORAL | Status: DC | PRN

## 2014-03-14 NOTE — Assessment & Plan Note (Signed)
Likely viral, nontoxic.   Out of work for now.  Use tessalon for cough, use flonase for the nasal symptoms.  Rest and fluids in the meantime.  If not better in a few days, then start the zmax.  D/w pt. He agrees.

## 2014-03-14 NOTE — Progress Notes (Signed)
Sx started about 4 days ago.  ST initially, progressively worse for a few days.  Diffusely weak, aching. Coughing.  No help with OTC meds. Out of work yesterday and today.   Chills, then would feel hot. Runny nose.  Back and chest and abd wall sore from coughing.  Glands felt sore in his neck.  No sputum.  No ST now.  B ear pain.  Meds, vitals, and allergies reviewed.   ROS: See HPI.  Otherwise, noncontributory.  GEN: nad, alert and oriented HEENT: mucous membranes moist, tm w/o erythema, nasal exam w/o erythema, clear discharge noted,  OP with cobblestoning NECK: supple w/o LA CV: rrr.   PULM: ctab, no inc wob EXT: no edema SKIN: no acute rash

## 2014-03-14 NOTE — Patient Instructions (Signed)
Out of work for now.  Use tessalon for cough, use flonase for the nasal symptoms. Rest and fluids in the meantime.  If not better in a few days, then start the antibiotics.  Take care.

## 2014-08-09 ENCOUNTER — Telehealth: Payer: Self-pay

## 2014-08-09 NOTE — Telephone Encounter (Signed)
Diabetic Bundle. Left voicemail advising pt his A1C blood test is due. Pt advised to contact PCP's office to schedule.  

## 2014-11-01 ENCOUNTER — Ambulatory Visit (INDEPENDENT_AMBULATORY_CARE_PROVIDER_SITE_OTHER): Payer: Federal, State, Local not specified - PPO | Admitting: Family Medicine

## 2014-11-01 ENCOUNTER — Encounter: Payer: Self-pay | Admitting: Family Medicine

## 2014-11-01 VITALS — BP 102/62 | HR 102 | Temp 98.2°F | Wt 215.0 lb

## 2014-11-01 DIAGNOSIS — Z23 Encounter for immunization: Secondary | ICD-10-CM | POA: Diagnosis not present

## 2014-11-01 DIAGNOSIS — IMO0002 Reserved for concepts with insufficient information to code with codable children: Secondary | ICD-10-CM

## 2014-11-01 DIAGNOSIS — E1165 Type 2 diabetes mellitus with hyperglycemia: Secondary | ICD-10-CM

## 2014-11-01 DIAGNOSIS — E119 Type 2 diabetes mellitus without complications: Secondary | ICD-10-CM

## 2014-11-01 LAB — LIPID PANEL
Cholesterol: 233 mg/dL — ABNORMAL HIGH (ref 0–200)
HDL: 42.2 mg/dL (ref >39.00)
Total CHOL/HDL Ratio: 6
Triglycerides: 436 mg/dL — ABNORMAL HIGH (ref 0.0–149.0)

## 2014-11-01 LAB — COMPREHENSIVE METABOLIC PANEL
ALT: 25 U/L (ref 0–53)
AST: 17 U/L (ref 0–37)
Albumin: 4.8 g/dL (ref 3.5–5.2)
Alkaline Phosphatase: 69 U/L (ref 39–117)
BUN: 13 mg/dL (ref 6–23)
CO2: 29 mEq/L (ref 19–32)
Calcium: 10.1 mg/dL (ref 8.4–10.5)
Chloride: 94 mEq/L — ABNORMAL LOW (ref 96–112)
Creatinine, Ser: 1.13 mg/dL (ref 0.40–1.50)
GFR: 74.2 mL/min (ref >60.00)
Glucose, Bld: 268 mg/dL — ABNORMAL HIGH (ref 70–99)
Potassium: 4.4 mEq/L (ref 3.5–5.1)
Sodium: 133 mEq/L — ABNORMAL LOW (ref 135–145)
Total Bilirubin: 0.5 mg/dL (ref 0.2–1.2)
Total Protein: 7.9 g/dL (ref 6.0–8.3)

## 2014-11-01 LAB — HEMOGLOBIN A1C: Hgb A1c MFr Bld: 10 % — ABNORMAL HIGH (ref 4.6–6.5)

## 2014-11-01 LAB — LDL CHOLESTEROL, DIRECT: Direct LDL: 130 mg/dL

## 2014-11-01 MED ORDER — METFORMIN HCL 1000 MG PO TABS: 1000.0000 mg | ORAL_TABLET | Freq: Every day | ORAL | Status: DC

## 2014-11-01 MED ORDER — OMEPRAZOLE 20 MG PO CPDR: 20.0000 mg | DELAYED_RELEASE_CAPSULE | Freq: Every day | ORAL | Status: DC

## 2014-11-01 MED ORDER — SIMVASTATIN 40 MG PO TABS: 40.0000 mg | ORAL_TABLET | Freq: Every day | ORAL | Status: DC

## 2014-11-01 MED ORDER — PIOGLITAZONE HCL 15 MG PO TABS: 15.0000 mg | ORAL_TABLET | Freq: Every day | ORAL | Status: DC

## 2014-11-01 MED ORDER — LISINOPRIL 20 MG PO TABS: 20.0000 mg | ORAL_TABLET | Freq: Every day | ORAL | Status: DC

## 2014-11-01 NOTE — Progress Notes (Signed)
Pre visit review using our clinic review tool, if applicable. No additional management support is needed unless otherwise documented below in the visit note.  Diabetes:  Using medications without difficulties: yes Hypoglycemic episodes:no Hyperglycemic episodes:no Feet problems:no Blood Sugars averaging: not checked.  eye exam within last year: encouraged, due.   Walking and riding with work.   Due for labs.  Diet and exercise d/w pt, encouraged.    Patient declined HIV screening.   Meds, vitals, and allergies reviewed.   ROS: See HPI.  Otherwise negative.    GEN: nad, alert and oriented HEENT: mucous membranes moist NECK: supple w/o LA CV: rrr. PULM: ctab, no inc wob ABD: soft, +bs EXT: no edema SKIN: no acute rash  Diabetic foot exam: Normal inspection No skin breakdown No calluses  Normal DP pulses Normal sensation to light touch and monofilament Nails normal

## 2014-11-01 NOTE — Patient Instructions (Addendum)
Call about an eye exam.  Go to the lab on the way out.  We'll contact you with your lab report. Take care.  Glad to see you.   Recheck in about 6 months.  We may need to get together sooner, depending on your labs.

## 2014-11-02 NOTE — Addendum Note (Signed)
Addended byCleda Clarks B on: 11/02/2014 08:15 AM   Modules accepted: Orders

## 2014-11-02 NOTE — Assessment & Plan Note (Signed)
See notes on labs.   Will likely need insulin start.  Will encourage patient to f/u for discussion.  Needs work on diet and weight.  Encouraged routine f/u in general.

## 2014-11-19 ENCOUNTER — Encounter: Payer: Self-pay | Admitting: Family Medicine

## 2014-11-19 ENCOUNTER — Ambulatory Visit (INDEPENDENT_AMBULATORY_CARE_PROVIDER_SITE_OTHER): Payer: Federal, State, Local not specified - PPO | Admitting: Family Medicine

## 2014-11-19 VITALS — BP 118/74 | HR 96 | Temp 97.8°F | Wt 217.2 lb

## 2014-11-19 DIAGNOSIS — E1165 Type 2 diabetes mellitus with hyperglycemia: Secondary | ICD-10-CM

## 2014-11-19 DIAGNOSIS — IMO0001 Reserved for inherently not codable concepts without codable children: Secondary | ICD-10-CM

## 2014-11-19 MED ORDER — METFORMIN HCL 1000 MG PO TABS: 1000.0000 mg | ORAL_TABLET | Freq: Every day | ORAL | Status: DC

## 2014-11-19 NOTE — Patient Instructions (Signed)
You can do what you want to do.  I would start insulin now.  Once daily shot.  Lantus pen.  The other option- which likely isn't as good an option- If you want to take extra metformin, then take and extra 1/2 tab a day.  See if you can tolerate it. Either way, check your sugar daily and update me.  Work on cutting out sugar in the meantime.   Recheck A1c in about 3 months.

## 2014-11-19 NOTE — Progress Notes (Signed)
Pre visit review using our clinic review tool, if applicable. No additional management support is needed unless otherwise documented below in the visit note.  Uncontrolled DM2 f/u.  He still feels well. Not checking sugar.  Labs d/w pt.  He doesn't have sx of hyper or hypoglycemia. See plan.  Meds, vitals, and allergies reviewed.   ROS: See HPI.  Otherwise, noncontributory.  nad Exam deferred.

## 2014-11-20 NOTE — Assessment & Plan Note (Signed)
>  25 minutes spent in face to face time with patient, >50% spent in counselling or coordination of care.  Offered, encouraged insulin tx.  He declined that.  He wants to inc metformin.  I told him I can't stop him from inc in metformin, but he'll likely either not tolerate it (ie will have GI sx) or will not get sugar controlled.  Still likely will need insulin.  He didn't commit to insulin.  Needs more work on diet and exercise, start checking sugars.  He is to update me in 2 weeks, sooner if needed.

## 2015-01-25 ENCOUNTER — Telehealth: Payer: Self-pay | Admitting: Family Medicine

## 2015-01-25 NOTE — Telephone Encounter (Signed)
Pt was contacted via email stating there was a new Diabetes Management program through [facility],  he could participate and receive diabetic products.  When he filled out the information online, he was sent a confirmation saying he was one the first 200 patients to respond and then given an online form to sign.  He got suspicious and called us.  He stated the name of the Program was [facility].  I confirmed with Almira CoasterGina and told him we did not have any knowledge of this program and it probably was a scam. Wellsite geologistmailed clinical nurse mgr and director to get additional information.

## 2015-01-25 NOTE — Telephone Encounter (Signed)
Called pt back and let him know that [facility] IS a [facility] sponsored program and that this was a great opportunity to combine innovative technology to improve his health care.  He was excited to know and will continue with the signup process.  I apologized for not knowing firsthand, he was gracious!

## 2015-01-27 NOTE — Telephone Encounter (Signed)
Duly noted.  Thanks. 

## 2015-02-20 ENCOUNTER — Other Ambulatory Visit: Payer: Self-pay

## 2015-02-20 MED ORDER — METFORMIN HCL 1000 MG PO TABS: 1000.0000 mg | ORAL_TABLET | Freq: Every day | ORAL | Status: DC

## 2015-02-20 NOTE — Telephone Encounter (Signed)
[facility] left v/m requesting corrected instructions; spoke with pt and pt is taking 1 1/2 metformin 1000 mg daily per 11/19/14 instructions at office visit. New rx sent to pharmacy. [facility] with megan at [facility] and any other metformin rx was d/ced. Pt's wife will pick up rx later today.

## 2015-05-16 ENCOUNTER — Ambulatory Visit (INDEPENDENT_AMBULATORY_CARE_PROVIDER_SITE_OTHER): Payer: Federal, State, Local not specified - PPO | Admitting: Family Medicine

## 2015-05-16 ENCOUNTER — Encounter: Payer: Self-pay | Admitting: Family Medicine

## 2015-05-16 VITALS — BP 132/74 | HR 118 | Temp 98.7°F | Wt 217.8 lb

## 2015-05-16 DIAGNOSIS — R05 Cough: Secondary | ICD-10-CM

## 2015-05-16 DIAGNOSIS — J101 Influenza due to other identified influenza virus with other respiratory manifestations: Secondary | ICD-10-CM | POA: Diagnosis not present

## 2015-05-16 DIAGNOSIS — R059 Cough, unspecified: Secondary | ICD-10-CM

## 2015-05-16 LAB — POCT INFLUENZA A/B
Influenza A, POC: NEGATIVE
Influenza B, POC: POSITIVE — AB

## 2015-05-16 MED ORDER — HYDROCODONE-HOMATROPINE 5-1.5 MG/5ML PO SYRP: ORAL_SOLUTION | ORAL | Status: DC

## 2015-05-16 NOTE — Progress Notes (Signed)
Pre visit review using our clinic review tool, if applicable. No additional management support is needed unless otherwise documented below in the visit note. 

## 2015-05-16 NOTE — Progress Notes (Signed)
Dr. Karleen Hampshire T. Beatriz Settles, MD, CAQ Sports Medicine Primary [facility] and Sports Medicine [address] [place], [address] Phone: [phone] Fax: [phone]  05/16/2015  Patient: Harry Whitaker, MRN: [id number], DOB: Sep 03, 1970, 45 y.o.  Primary Physician:  Crawford Givens, MD   Chief Complaint  Patient presents with  . Cough  . Generalized Body Aches   Subjective:   [name] presents with runny nose, sneezing, cough, sore throat, malaise, myalgias, arthralgia, chills, and fever. Feel horrible, 3 days, coughing and drainage and hurting all the time. Coughing up all the time. Works as a Transport planner.   Has not checked a fever.  Feeling all drained out - like barely move.   + recent exposure to others with similar symptoms.   The patent denies sore throat as the primary complaint. Denies sthortness of breath/wheezing, otalgia, facial pain, abdominal pain, changes in bowel or bladder.  Generally feels terrible  Tmax: not sure  PMH, PHS, Allergies, Problem List, Medications, Family History, and Social History have all been reviewed.  Patient Active Problem List   Diagnosis Date Noted  . Skin lesion 01/04/2014  . Routine general medical examination at a health care facility 01/07/2012  . Diabetes type 2, uncontrolled (HCC) 01/21/2010  . HYPERLIPIDEMIA 01/21/2010  . HYPERTENSION 01/21/2010  . GERD 01/21/2010    Past Medical History  Diagnosis Date  . Hyperlipidemia   . Hypertension   . GERD (gastroesophageal reflux disease)   . Diabetes mellitus     Past Surgical History  Procedure Laterality Date  . Back surgery  2009    Herniated Disk    Social History   Social History  . Marital Status: Married    Spouse Name: N/A  . Number of Children: 1  . Years of Education: N/A   Occupational History  . 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 Smoking status: Never Smoker   . Smokeless tobacco: Never Used  . Alcohol Use: 0.0  oz/week    0 Standard drinks or equivalent per week     Comment: Rare  . Drug Use: No  . Sexual Activity: Not on file   Other Topics Concern  . Not on file   Social History Narrative   Regular exercise:  Yes, walks some for postal route.   Education:  [employer], [place] [employer]   Married 1996   1 daughter    Family History  Problem Relation Age of Onset  . Diabetes Mother   . Hyperlipidemia Mother   . Hypertension Mother   . Diabetes Father   . Heart disease Brother   . Prostate cancer Neg Hx     Allergies  Allergen Reactions  . Metformin And Related     Intolerant of >1000mg  a day due to GI upset.     Medication list reviewed and updated in full in [facility].  ROS as above, eating and drinking - tolerating PO. Urinating normally. No excessive vomitting or diarrhea.   Objective:   Blood pressure 132/74, pulse 118, temperature 98.7 F (37.1 C), temperature source Oral, weight 217 lb 12 oz (98.771 kg), SpO2 98 %.  Gen: WDWN, NAD; A & O x3, cooperative. Pleasant.Globally Non-toxic HEENT: Normocephalic and atraumatic. Throat clear, w/o exudate, R TM clear, L TM - good landmarks, No fluid present. rhinnorhea. No frontal or maxillary sinus T. MMM NECK: Anterior cervical  LAD is absent CV: RRR, No M/G/R, cap refill <2 sec  PULM: Breathing comfortably in no respiratory distress. no wheezing, crackles, rhonchi ABD: S,NT,ND,+BS. No HSM. No rebound. EXT: No c/c/e PSYCH: Friendly, good eye contact MSK: Nml gait  Results for orders placed or performed in visit on 05/16/15  POCT Influenza A/B  Result Value Ref Range   Influenza A, POC Negative Negative   Influenza B, POC Positive (A) Negative    Assessment and Plan:   Influenza B  Cough - Plan: POCT Influenza A/B  The patient's clinical exam and history is consistent with a diagnosis of influenza.  Supportive care, fluids, cough medicines as needed, and anti-pyretics. Infection control emphasized, including  OOW or school until AF 24 hours.  Follow-up: prn  New Prescriptions   HYDROCODONE-HOMATROPINE (HYCODAN) 5-1.5 MG/5ML SYRUP    1 tsp po at night before bed prn cough   Modified Medications   No medications on file   Orders Placed This Encounter  Procedures  . POCT Influenza A/B    Signed,  Talina Pleitez T. Jerlean Peralta, MD   Patient's Medications  New Prescriptions   HYDROCODONE-HOMATROPINE (HYCODAN) 5-1.5 MG/5ML SYRUP    1 tsp po at night before bed prn cough  Previous Medications   LISINOPRIL (PRINIVIL,ZESTRIL) 20 MG TABLET    Take 1 tablet (20 mg total) by mouth daily.   METFORMIN (GLUCOPHAGE) 1000 MG TABLET    Take 1 tablet (1,000 mg total) by mouth daily. With an extra 1/2 tab daily if tolerated.   OMEPRAZOLE (PRILOSEC) 20 MG CAPSULE    Take 1 capsule (20 mg total) by mouth daily.   PIOGLITAZONE (ACTOS) 15 MG TABLET    Take 1 tablet (15 mg total) by mouth daily.   SIMVASTATIN (ZOCOR) 40 MG TABLET    Take 1 tablet (40 mg total) by mouth at bedtime.  Modified Medications   No medications on file  Discontinued Medications   No medications on file

## 2015-08-05 ENCOUNTER — Other Ambulatory Visit: Payer: Self-pay | Admitting: Family Medicine

## 2015-08-13 ENCOUNTER — Ambulatory Visit: Payer: Federal, State, Local not specified - PPO | Admitting: Family Medicine

## 2015-12-19 ENCOUNTER — Other Ambulatory Visit: Payer: Self-pay | Admitting: Family Medicine

## 2015-12-19 NOTE — Telephone Encounter (Signed)
Schedule labs and then OV.  Labs need to be done before OV.  I don't want them done at OV.  Then send meds as in EMR.   Thanks.

## 2015-12-19 NOTE — Telephone Encounter (Signed)
Electronic refill request. Last office visit:   05/16/15 acute with Dr. Patsy Lageropland.  Last office visit with Dr. Algis Downs 11/19/14.   Last Filled:   Pioglitazone, Simvastatin & Lisinopril 90 tablet 3 11/01/2014   Please advise.

## 2015-12-19 NOTE — Telephone Encounter (Signed)
Scheduled labs 12/25/15.  Patient will schedule OV when he comes in for the labwork.  Meds sent in as requested.

## 2015-12-25 ENCOUNTER — Other Ambulatory Visit: Payer: Self-pay | Admitting: Family Medicine

## 2015-12-25 ENCOUNTER — Other Ambulatory Visit (INDEPENDENT_AMBULATORY_CARE_PROVIDER_SITE_OTHER): Payer: Federal, State, Local not specified - PPO

## 2015-12-25 DIAGNOSIS — IMO0001 Reserved for inherently not codable concepts without codable children: Secondary | ICD-10-CM

## 2015-12-25 DIAGNOSIS — E1165 Type 2 diabetes mellitus with hyperglycemia: Principal | ICD-10-CM

## 2015-12-25 LAB — COMPREHENSIVE METABOLIC PANEL
ALT: 19 U/L (ref 0–53)
AST: 16 U/L (ref 0–37)
Albumin: 4.5 g/dL (ref 3.5–5.2)
Alkaline Phosphatase: 61 U/L (ref 39–117)
BUN: 11 mg/dL (ref 6–23)
CO2: 30 mEq/L (ref 19–32)
Calcium: 10 mg/dL (ref 8.4–10.5)
Chloride: 97 mEq/L (ref 96–112)
Creatinine, Ser: 1.02 mg/dL (ref 0.40–1.50)
GFR: 83.1 mL/min (ref >60.00)
Glucose, Bld: 249 mg/dL — ABNORMAL HIGH (ref 70–99)
Potassium: 5.1 mEq/L (ref 3.5–5.1)
Sodium: 136 mEq/L (ref 135–145)
Total Bilirubin: 0.6 mg/dL (ref 0.2–1.2)
Total Protein: 7 g/dL (ref 6.0–8.3)

## 2015-12-25 LAB — LIPID PANEL
Cholesterol: 222 mg/dL — ABNORMAL HIGH (ref 0–200)
HDL: 39 mg/dL — ABNORMAL LOW (ref >39.00)
Total CHOL/HDL Ratio: 6
Triglycerides: 488 mg/dL — ABNORMAL HIGH (ref 0.0–149.0)

## 2015-12-25 LAB — HEMOGLOBIN A1C: Hgb A1c MFr Bld: 9.9 % — ABNORMAL HIGH (ref 4.6–6.5)

## 2015-12-25 LAB — LDL CHOLESTEROL, DIRECT: Direct LDL: 119 mg/dL

## 2016-01-02 ENCOUNTER — Encounter: Payer: Self-pay | Admitting: Family Medicine

## 2016-01-02 ENCOUNTER — Ambulatory Visit (INDEPENDENT_AMBULATORY_CARE_PROVIDER_SITE_OTHER): Payer: Federal, State, Local not specified - PPO | Admitting: Family Medicine

## 2016-01-02 VITALS — BP 126/76 | HR 91 | Temp 98.5°F | Wt 218.0 lb

## 2016-01-02 DIAGNOSIS — Z794 Long term (current) use of insulin: Secondary | ICD-10-CM | POA: Diagnosis not present

## 2016-01-02 DIAGNOSIS — E785 Hyperlipidemia, unspecified: Secondary | ICD-10-CM

## 2016-01-02 DIAGNOSIS — E1165 Type 2 diabetes mellitus with hyperglycemia: Secondary | ICD-10-CM | POA: Diagnosis not present

## 2016-01-02 DIAGNOSIS — IMO0001 Reserved for inherently not codable concepts without codable children: Secondary | ICD-10-CM

## 2016-01-02 DIAGNOSIS — Z0000 Encounter for general adult medical examination without abnormal findings: Secondary | ICD-10-CM

## 2016-01-02 MED ORDER — INSULIN PEN NEEDLE 31G X 5 MM MISC: 99 refills | Status: DC

## 2016-01-02 MED ORDER — BASAGLAR KWIKPEN 100 UNIT/ML ~~LOC~~ SOPN: PEN_INJECTOR | SUBCUTANEOUS | 1 refills | Status: DC

## 2016-01-02 NOTE — Assessment & Plan Note (Signed)
Controlled Continue current meds 

## 2016-01-02 NOTE — Progress Notes (Signed)
Pre visit review using our clinic review tool, if applicable. No additional management support is needed unless otherwise documented below in the visit note. 

## 2016-01-02 NOTE — Assessment & Plan Note (Addendum)
Continue statin. Needs work on diet and exercise.

## 2016-01-02 NOTE — Progress Notes (Signed)
Diabetes:  Using medications without difficulties:yes Hypoglycemic episodes:not checked, no sx Hyperglycemic episodes: not checked, no sx Feet problems: no Blood Sugars averaging: not checked eye exam within last year:  Due, encouraged.   Hypertension:    Using medication without problems or lightheadedness: yes Chest pain with exertion:no Edema:no Short of breath:no  Elevated Cholesterol: Using medications without problems:yes Muscle aches: no Diet compliance: encouraged Exercise: walking at work, at baseline.   PMH and SH reviewed.   Vital signs, Meds and allergies reviewed.  ROS: Per HPI unless specifically indicated in ROS section   GEN: nad, alert and oriented HEENT: mucous membranes moist NECK: supple w/o LA CV: rrr.   PULM: ctab, no inc wob ABD: soft, +bs EXT: no edema SKIN: no acute rash  Diabetic foot exam: Normal inspection No skin breakdown No calluses  Normal DP pulses Normal sensation to light tough and monofilament Nails normal

## 2016-01-02 NOTE — Patient Instructions (Signed)
Start 5 units of insulin at night.  If AM sugar >150, then add 1 unit to next dose.  If AM sugar <100, then decrease 1 unit with next dose.  If AM sugar 100-150, then no change in the next dose.  Update me in about 2 weeks.  Recheck labs in about 3 months before a visit.  Let me know if you have questions about insulin use.  Take care.  Glad to see you.

## 2016-01-02 NOTE — Assessment & Plan Note (Addendum)
He never followed up from last year. A1c still elevated. He has not been checking his sugar. Needs more work on diet and exercise. Needs to check his sugar. Prescription written for meter with lancets and strips. Check sugar daily. Discussed with him about med options. A1c is been high enough to need insulin treatment. He agreed to start insulin. Demonstration pen discussed with patient. Written instructions given to patient. All discussed with patient, demonstrated. He understood aseptic technique to effectively and safely give insulin injection with a pen. Prescription sent. Start basaglar 5 units a day. If blood sugar is above 150 in the morning, then add 1 unit to previous days dose. If blood sugar is below 100 in the morning, decrease 1 unit from previous days dose. If blood sugar is between 100 and 150 then continue with previous dose. I offered for him to come back to the clinic and get further teaching and give his first injection here at the clinic. He declined this. He thought he can do it on his own. All questions answered. I want him to update me in about 2 weeks with his sugar, sooner if needed. Update me at that point about his insulin dose. Recheck labs in 3 months. I emphasized to patient the need for routine follow-up and for him to stay in contact with me. >25 minutes spent in face to face time with patient, >50% spent in counselling or coordination of care.  Continue other meds in the meantime.

## 2016-02-27 ENCOUNTER — Telehealth: Payer: Self-pay

## 2016-02-27 NOTE — Telephone Encounter (Signed)
Gradually inc insulin and work on diet.  Given his diet, size and prev sugars, it will likely take a higher dose to get effect.  Glad he started insulin.  Please update me in about 10-14 days.  Thanks.   If AM sugar >150, then add 1 unit to next dose.  If AM sugar <100, then decrease 1 unit with next dose.  If AM sugar 100-150, then no change in the next dose.

## 2016-02-27 NOTE — Telephone Encounter (Signed)
Pt last seen 01/02/16; pt is calling in 2 week update; presently pt is taking 37 units of insulin; FBS averaging around 250; has been as low as 210 and as high as 310.pt does not feel any different since starting insulin; pt feels OK but concerned about high FBS. Pt states he is not watching his diet any better; then pt said he had cut back on sodas and pt is eating a little less.pt request cb from Dr Para Marchuncan about if should continue to increase insulin or should pt have another oral med to take with insulin for diabetes or what to do. [facility] [address]

## 2016-02-27 NOTE — Telephone Encounter (Signed)
Patient advised.

## 2016-02-27 NOTE — Telephone Encounter (Signed)
Yes, 1/2 dose in the AM, 1/2 dose in the PM.  Would keep the doses as equal as possible.  Thanks.

## 2016-02-27 NOTE — Telephone Encounter (Signed)
Patient advised.   Patient says that since he has surpassed the 30 unit doses, it is a bit painful to inject that much insulin.  Is it possible for him to split the dose between a morning and evening dose?

## 2016-04-03 ENCOUNTER — Other Ambulatory Visit: Payer: Self-pay | Admitting: Family Medicine

## 2016-04-15 ENCOUNTER — Telehealth: Payer: Self-pay | Admitting: *Deleted

## 2016-04-15 NOTE — Telephone Encounter (Signed)
Pt left v/m; pt has been taking 50 units daily of the insulin(Basaglar). Pt request cb.

## 2016-04-15 NOTE — Telephone Encounter (Signed)
Received a form from pharmacy stating that patient is using up to 50 units of Basaglar daily. Pharmacy is requesting a new script with the correct dose.

## 2016-04-16 MED ORDER — BASAGLAR KWIKPEN 100 UNIT/ML ~~LOC~~ SOPN
PEN_INJECTOR | SUBCUTANEOUS | 1 refills | Status: DC
Start: 2016-04-16 — End: 2016-05-25

## 2016-04-16 NOTE — Telephone Encounter (Signed)
Sent.  Due for f/u OV with labs ahead of time.

## 2016-04-17 NOTE — Telephone Encounter (Signed)
[facility] for [facility] to call back and schedule appt for labs, and f/u with Dr. Para Marchuncan.

## 2016-04-20 ENCOUNTER — Encounter: Payer: Self-pay | Admitting: *Deleted

## 2016-04-22 ENCOUNTER — Encounter: Payer: Self-pay | Admitting: *Deleted

## 2016-04-22 NOTE — Telephone Encounter (Signed)
Letter mailed to patient to call the office and schedule his appointment.

## 2016-04-29 ENCOUNTER — Telehealth: Payer: Self-pay | Admitting: Family Medicine

## 2016-04-29 DIAGNOSIS — E1165 Type 2 diabetes mellitus with hyperglycemia: Principal | ICD-10-CM

## 2016-04-29 DIAGNOSIS — Z794 Long term (current) use of insulin: Principal | ICD-10-CM

## 2016-04-29 DIAGNOSIS — IMO0001 Reserved for inherently not codable concepts without codable children: Secondary | ICD-10-CM

## 2016-04-29 NOTE — Telephone Encounter (Signed)
Pt is requesting labs for a1c.   He has follow up appt scheduled as well.  Thanks

## 2016-04-30 NOTE — Telephone Encounter (Signed)
Ordered. Thanks

## 2016-05-07 ENCOUNTER — Other Ambulatory Visit (INDEPENDENT_AMBULATORY_CARE_PROVIDER_SITE_OTHER): Payer: Federal, State, Local not specified - PPO

## 2016-05-07 DIAGNOSIS — E1165 Type 2 diabetes mellitus with hyperglycemia: Secondary | ICD-10-CM

## 2016-05-07 DIAGNOSIS — IMO0001 Reserved for inherently not codable concepts without codable children: Secondary | ICD-10-CM

## 2016-05-07 DIAGNOSIS — Z794 Long term (current) use of insulin: Secondary | ICD-10-CM

## 2016-05-07 LAB — HEMOGLOBIN A1C: Hgb A1c MFr Bld: 9.7 % — ABNORMAL HIGH (ref 4.6–6.5)

## 2016-05-25 ENCOUNTER — Encounter: Payer: Self-pay | Admitting: Family Medicine

## 2016-05-25 ENCOUNTER — Ambulatory Visit (INDEPENDENT_AMBULATORY_CARE_PROVIDER_SITE_OTHER): Payer: Federal, State, Local not specified - PPO | Admitting: Family Medicine

## 2016-05-25 DIAGNOSIS — E1165 Type 2 diabetes mellitus with hyperglycemia: Secondary | ICD-10-CM | POA: Diagnosis not present

## 2016-05-25 DIAGNOSIS — Z794 Long term (current) use of insulin: Secondary | ICD-10-CM

## 2016-05-25 DIAGNOSIS — IMO0001 Reserved for inherently not codable concepts without codable children: Secondary | ICD-10-CM

## 2016-05-25 MED ORDER — METFORMIN HCL 1000 MG PO TABS: ORAL_TABLET | ORAL | 3 refills | Status: DC

## 2016-05-25 MED ORDER — LISINOPRIL 20 MG PO TABS: 20.0000 mg | ORAL_TABLET | Freq: Every day | ORAL | 3 refills | Status: DC

## 2016-05-25 MED ORDER — BASAGLAR KWIKPEN 100 UNIT/ML ~~LOC~~ SOPN: PEN_INJECTOR | SUBCUTANEOUS | 99 refills | Status: DC

## 2016-05-25 MED ORDER — SIMVASTATIN 40 MG PO TABS: 40.0000 mg | ORAL_TABLET | Freq: Every day | ORAL | 3 refills | Status: DC

## 2016-05-25 NOTE — Progress Notes (Signed)
Diabetes:  Using medications without difficulties: off metformin.  We discussed.  I don't see instruction to stop med in EMR.  He didn't have ADE on metformin.   Hypoglycemic episodes: no Hyperglycemic episodes: no Feet problems:no Blood Sugars averaging: ~200 recently.   eye exam within last year:  Due, d/w pt.   Off metformin, see above.   Taking 50 units of insulin a day, he has been doing his shots in his legs and that is more comfortable than his abd wall.   A1c d/w pt.  Not controlled.   His wife had a MI and CABG this spring.  This has been a sig upheaval for patient but they are working through it as she recovers.   Meds, vitals, and allergies reviewed.   ROS: Per HPI unless specifically indicated in ROS section   GEN: nad, alert and oriented HEENT: mucous membranes moist NECK: supple w/o LA CV: rrr. PULM: ctab, no inc wob ABD: soft, +bs EXT: no edema

## 2016-05-25 NOTE — Assessment & Plan Note (Signed)
Continue work on diet and exercise.  Add back metformin, 1/2 tab a day for about 1 week, then 1 tab a day.  Then inc insulin by 1 unit a day if needed.  D/w pt.  See AVS.  He understood.  Recheck in about 3 months, I want him to update me in the meantime.  He agrees.

## 2016-05-25 NOTE — Patient Instructions (Addendum)
Add back the metformin in the meantime.  Start with 1/2 tab a day for about 1 week, then go up to 1 pill a day if tolerated.   Keep adding a unit a day until your fasting sugar is <150.  You may not need to add a lot of insulin if the metformin has a significant effect.   Update me as needed.  Recheck in about 3 months.  Labs ahead of time.  Take care.  Glad to see you.

## 2016-09-21 DIAGNOSIS — R1013 Epigastric pain: Secondary | ICD-10-CM | POA: Diagnosis not present

## 2016-10-05 ENCOUNTER — Ambulatory Visit (INDEPENDENT_AMBULATORY_CARE_PROVIDER_SITE_OTHER): Payer: Federal, State, Local not specified - PPO | Admitting: Sports Medicine

## 2016-10-05 ENCOUNTER — Encounter: Payer: Self-pay | Admitting: Sports Medicine

## 2016-10-05 VITALS — BP 110/80 | HR 87 | Ht 72.0 in | Wt 221.6 lb

## 2016-10-05 DIAGNOSIS — Z794 Long term (current) use of insulin: Secondary | ICD-10-CM

## 2016-10-05 DIAGNOSIS — IMO0001 Reserved for inherently not codable concepts without codable children: Secondary | ICD-10-CM

## 2016-10-05 DIAGNOSIS — S6981XA Other specified injuries of right wrist, hand and finger(s), initial encounter: Secondary | ICD-10-CM | POA: Diagnosis not present

## 2016-10-05 DIAGNOSIS — E1165 Type 2 diabetes mellitus with hyperglycemia: Secondary | ICD-10-CM | POA: Diagnosis not present

## 2016-10-05 MED ORDER — DICLOFENAC SODIUM 2 % TD SOLN: 1.0000 "application " | Freq: Two times a day (BID) | TRANSDERMAL | 2 refills | Status: DC

## 2016-10-05 MED ORDER — DICLOFENAC SODIUM 2 % TD SOLN: 1.0000 "application " | Freq: Two times a day (BID) | TRANSDERMAL | 0 refills | Status: AC

## 2016-10-05 NOTE — Assessment & Plan Note (Signed)
Like to try to avoid systemic anti-inflammatories as well as corticosteroid injections due to his uncontrolled sugars.

## 2016-10-05 NOTE — Progress Notes (Signed)
OFFICE VISIT NOTE [facility]. Delorise Shiner Sports Medicine [facility] [phone]  Harry Whitaker - 46 y.o. male MRN [id number]  Date of birth: Oct 25, 1970  Visit Date: 10/05/2016  PCP: Joaquim Nam, MD   Referred by: Joaquim Nam, MD  Clovis Cao, CMA acting as scribe for Dr. Berline Chough.  SUBJECTIVE:   Chief Complaint  Patient presents with  . RT Wrist Pain   HPI: As below and per problem based documentation when appropriate.   Harry Whitaker presents as  New Patient reporting a possibly injury to Ulnar side of LT wrist/forearm. There is noticeable swelling. Rotating wrist outward, brushing teeth and using a screw driver triggers the pain. There is a radiation of pain upward to elbow. Has been active in a bowling league this summer. Last Monday he did bowl and the next day is when sx started. Applied ice with some relief.     Review of Systems  Constitutional: Negative for chills, diaphoresis, fever, malaise/fatigue and weight loss.  HENT: Negative.   Eyes: Negative.   Respiratory: Negative.   Cardiovascular: Negative.   Gastrointestinal: Negative.   Genitourinary: Negative for dysuria, flank pain, frequency, hematuria and urgency.  Musculoskeletal: Positive for joint pain and myalgias. Negative for back pain, falls and neck pain.  Skin: Negative for itching and rash.  Neurological: Negative for weakness.  Endo/Heme/Allergies: Negative for environmental allergies and polydipsia. Does not bruise/bleed easily.  Psychiatric/Behavioral: Negative.     Otherwise per HPI.  HISTORY & PERTINENT PRIOR DATA:  No specialty comments available. He reports that he has never smoked. He has never used smokeless tobacco.   Recent Labs  12/25/15 1025 05/07/16 1018  HGBA1C 9.9* 9.7*   Medications & Allergies reviewed per EMR Patient Active Problem List   Diagnosis Date Noted  . 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 Skin  lesion 01/04/2014  . Routine general medical examination at a health care facility 01/07/2012  . Diabetes type 2, uncontrolled (HCC) 01/21/2010  . HLD (hyperlipidemia) 01/21/2010  . HYPERTENSION 01/21/2010  . GERD 01/21/2010   Past Medical History:  Diagnosis Date  . Diabetes mellitus   . GERD (gastroesophageal reflux disease)   . Hyperlipidemia   . Hypertension    Family History  Problem Relation Age of Onset  . Diabetes Mother   . Hyperlipidemia Mother   . Hypertension Mother   . Diabetes Father   . Heart disease Brother   . Prostate cancer Neg Hx    Past Surgical History:  Procedure Laterality Date  . BACK SURGERY  2009   Herniated Disk   Social History   Occupational History  . 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 Smoking status: Never Smoker  . Smokeless tobacco: Never Used  . Alcohol use 0.0 oz/week     Comment: Rare  . Drug use: No  . Sexual activity: Not on file    OBJECTIVE:  VS:  HT:6' (182.9 cm)   WT:221 lb 9.6 oz (100.5 kg)  BMI:30.1    BP:110/80  HR:87bpm  TEMP: ( )  RESP:99 % EXAM: Findings:  WDWN, NAD, Non-toxic appearing Alert & appropriately interactive Not depressed or anxious appearing No increased work of breathing. Pupils are equal. EOM intact without nystagmus No clubbing or cyanosis of the extremities appreciated No significant rashes/lesions/ulcerations overlying the examined area. Radial pulses 2+/4.  No significant generalized UE edema.  Sensation intact to light touch in upper extremities.  Right wrist: Overall normal-appearing.  No significant bossing.  He has full wrist extension flexion, ulnar deviation and radial deviation.  Pain with axial load and supination.  He has no significant crepitation with this.  No significant pain with resisted wrist extension or flexion.  No focal TTP over the anatomic snuffbox or DRUJ.  There is mild pain directly over the TFCC.     No  results found. ASSESSMENT & PLAN:     ICD-10-CM   1. Uncontrolled type 2 diabetes mellitus without complication, with long-term current use of insulin (HCC) E11.65    Z79.4   2. Injury of triangular fibrocartilage complex (TFCC) of right wrist, initial encounter S69.81XA   ================================================================= Injury of triangular fibrocartilage complex of right wrist Right lateral wrist pain consistent with TFCC irritation and flexor tendinitis.  He is interested in continuing to play with his bowling league and has gotten a compression sleeve as well as bowling brace.  We discussed that slowly continues to improve I am fine with him continuing to bowl but if he has acute flareup we may need consider an injection.  We will plan to follow-up with him on an as-needed basis he will call if any worsening symptoms.  Prescription and sample provided today for Pennsaid.  Diabetes type 2, uncontrolled Like to try to avoid systemic anti-inflammatories as well as corticosteroid injections due to his uncontrolled sugars. ================================================================= There are no Patient Instructions on file for this visit.================================================================= No future appointments.  Follow-up: Return if symptoms worsen or fail to improve.   CMA/ATC served as Neurosurgeon during this visit. History, Physical, and Plan performed by medical provider. Documentation and orders reviewed and attested to.      Gaspar Bidding, DO    [facility] Physician

## 2016-10-05 NOTE — Assessment & Plan Note (Signed)
Right lateral wrist pain consistent with TFCC irritation and flexor tendinitis.  He is interested in continuing to play with his bowling league and has gotten a compression sleeve as well as bowling brace.  We discussed that slowly continues to improve I am fine with him continuing to bowl but if he has acute flareup we may need consider an injection.  We will plan to follow-up with him on an as-needed basis he will call if any worsening symptoms.  Prescription and sample provided today for Pennsaid.

## 2016-10-06 ENCOUNTER — Ambulatory Visit: Payer: Federal, State, Local not specified - PPO | Admitting: Sports Medicine

## 2016-12-28 ENCOUNTER — Emergency Department (HOSPITAL_COMMUNITY)
Admission: EM | Admit: 2016-12-28 | Discharge: 2016-12-29 | Disposition: A | Discharge status: Home / Self Care | Payer: Federal, State, Local not specified - PPO | Attending: Emergency Medicine | Admitting: Emergency Medicine

## 2016-12-28 ENCOUNTER — Other Ambulatory Visit: Payer: Self-pay

## 2016-12-28 ENCOUNTER — Encounter (HOSPITAL_COMMUNITY): Payer: Self-pay | Admitting: Emergency Medicine

## 2016-12-28 ENCOUNTER — Emergency Department (HOSPITAL_COMMUNITY): Payer: Federal, State, Local not specified - PPO

## 2016-12-28 DIAGNOSIS — Y92481 Parking lot as the place of occurrence of the external cause: Secondary | ICD-10-CM | POA: Insufficient documentation

## 2016-12-28 DIAGNOSIS — I10 Essential (primary) hypertension: Secondary | ICD-10-CM | POA: Insufficient documentation

## 2016-12-28 DIAGNOSIS — S60221A Contusion of right hand, initial encounter: Secondary | ICD-10-CM | POA: Diagnosis not present

## 2016-12-28 DIAGNOSIS — E119 Type 2 diabetes mellitus without complications: Secondary | ICD-10-CM | POA: Insufficient documentation

## 2016-12-28 DIAGNOSIS — Y9389 Activity, other specified: Secondary | ICD-10-CM | POA: Diagnosis not present

## 2016-12-28 DIAGNOSIS — Z23 Encounter for immunization: Secondary | ICD-10-CM | POA: Diagnosis not present

## 2016-12-28 DIAGNOSIS — S6991XA Unspecified injury of right wrist, hand and finger(s), initial encounter: Secondary | ICD-10-CM | POA: Diagnosis not present

## 2016-12-28 DIAGNOSIS — Y999 Unspecified external cause status: Secondary | ICD-10-CM | POA: Diagnosis not present

## 2016-12-28 DIAGNOSIS — Z79899 Other long term (current) drug therapy: Secondary | ICD-10-CM | POA: Diagnosis not present

## 2016-12-28 DIAGNOSIS — Z794 Long term (current) use of insulin: Secondary | ICD-10-CM | POA: Diagnosis not present

## 2016-12-28 DIAGNOSIS — W2209XA Striking against other stationary object, initial encounter: Secondary | ICD-10-CM | POA: Diagnosis not present

## 2016-12-28 DIAGNOSIS — M79641 Pain in right hand: Secondary | ICD-10-CM | POA: Diagnosis not present
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
## 2016-12-29 MED ORDER — TETANUS-DIPHTH-ACELL PERTUSSIS 5-2.5-18.5 LF-MCG/0.5 IM SUSP
0.5000 mL | Freq: Once | INTRAMUSCULAR | Status: AC
  Administered 2016-12-29: 0.5 mL via INTRAMUSCULAR
  Filled 2016-12-29: qty 0.5

## 2016-12-29 NOTE — Discharge Instructions (Signed)
Ice the hand Take Ibuprofen or use gel. You can alternate with Tylenol if needed Keep wounds clean and dry. Cover with bandage
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
## 2017-03-18 ENCOUNTER — Other Ambulatory Visit: Payer: Self-pay | Admitting: Family Medicine

## 2017-06-02 ENCOUNTER — Telehealth: Payer: Self-pay | Admitting: Family Medicine

## 2017-06-02 NOTE — Telephone Encounter (Signed)
Copied from CRM [phone]. Topic: Quick Communication - Rx Refill/Question >> Jun 02, 2017  1:57 PM Diana EvesHoyt, Maryann B wrote: Medication: Insulin Glargine (BASAGLAR KWIKPEN) 100 UNIT/ML SOPN                     lisinopril (PRINIVIL,ZESTRIL) 20 MG tablet                     metFORMIN (GLUCOPHAGE) 1000 MG tablet                      omeprazole (PRILOSEC) 20 MG capsule                    simvastatin (ZOCOR) 40 MG tablet   Pt has CPE scheduled for 07/19/17 and hoping to get refills until that appt.   Has the patient contacted their pharmacy? Yes.   (Agent: If no, request that the patient contact the pharmacy for the refill.) Preferred Pharmacy (with phone number or street name): [facility] - [place], [place] [facility] [place] - [id number] Agent: Please be advised that RX refills may take up to 3 business days. We ask that you follow-up with your pharmacy.

## 2017-06-03 NOTE — Telephone Encounter (Signed)
Pt has not been seen at [facility] since 05/2016 but f/u for DM 09/2016 at alternate office. Pt has upcoming appt 07/2017. pls advise

## 2017-06-06 MED ORDER — SIMVASTATIN 40 MG PO TABS: 40.0000 mg | ORAL_TABLET | Freq: Every day | ORAL | 0 refills | Status: DC

## 2017-06-06 MED ORDER — METFORMIN HCL 1000 MG PO TABS: ORAL_TABLET | ORAL | 0 refills | Status: DC

## 2017-06-06 MED ORDER — BASAGLAR KWIKPEN 100 UNIT/ML ~~LOC~~ SOPN: PEN_INJECTOR | SUBCUTANEOUS | 0 refills | Status: DC

## 2017-06-06 MED ORDER — OMEPRAZOLE 20 MG PO CPDR: 20.0000 mg | DELAYED_RELEASE_CAPSULE | Freq: Every day | ORAL | 0 refills | Status: DC

## 2017-06-06 MED ORDER — LISINOPRIL 20 MG PO TABS: 20.0000 mg | ORAL_TABLET | Freq: Every day | ORAL | 0 refills | Status: DC

## 2017-06-06 NOTE — Telephone Encounter (Signed)
Has to keep f/u.   DM2 is way more important than CPE at this point.  Keep the CPE as scheduled but he needs DM2 eval in the meantime.  Needs labs at [facility] in the next month re: DM2.   Don't wait on the CPE to do both together.   He has to keep some f/u or we'll have to dismiss him.   I don't want to discharge him since he's a really nice man but he has to help us help him.   rx sent.  Thanks.

## 2017-06-07 NOTE — Telephone Encounter (Signed)
Scheduled appointment for 8:30 am on April 30 with labs the same day.  Patient is trying to avoid having to miss work and has that day off.  Patient still has CPE appointment in June 2019.  Is this ok?

## 2017-06-08 NOTE — Telephone Encounter (Signed)
Yes. Thanks 

## 2017-06-15 ENCOUNTER — Ambulatory Visit: Payer: Federal, State, Local not specified - PPO | Admitting: Family Medicine

## 2017-06-15 ENCOUNTER — Encounter: Payer: Self-pay | Admitting: Family Medicine

## 2017-06-15 VITALS — BP 122/72 | HR 81 | Temp 98.0°F | Ht 72.0 in | Wt 219.5 lb

## 2017-06-15 DIAGNOSIS — E1165 Type 2 diabetes mellitus with hyperglycemia: Secondary | ICD-10-CM

## 2017-06-15 DIAGNOSIS — Z794 Long term (current) use of insulin: Secondary | ICD-10-CM

## 2017-06-15 DIAGNOSIS — IMO0001 Reserved for inherently not codable concepts without codable children: Secondary | ICD-10-CM

## 2017-06-15 LAB — LDL CHOLESTEROL, DIRECT: Direct LDL: 100 mg/dL

## 2017-06-15 LAB — COMPREHENSIVE METABOLIC PANEL
ALT: 15 U/L (ref 0–53)
AST: 14 U/L (ref 0–37)
Albumin: 4.4 g/dL (ref 3.5–5.2)
Alkaline Phosphatase: 71 U/L (ref 39–117)
BUN: 11 mg/dL (ref 6–23)
CO2: 30 mEq/L (ref 19–32)
Calcium: 9.3 mg/dL (ref 8.4–10.5)
Chloride: 99 mEq/L (ref 96–112)
Creatinine, Ser: 0.89 mg/dL (ref 0.40–1.50)
GFR: 96.65 mL/min (ref >60.00)
Glucose, Bld: 194 mg/dL — ABNORMAL HIGH (ref 70–99)
Potassium: 3.9 mEq/L (ref 3.5–5.1)
Sodium: 137 mEq/L (ref 135–145)
Total Bilirubin: 0.5 mg/dL (ref 0.2–1.2)
Total Protein: 7.2 g/dL (ref 6.0–8.3)

## 2017-06-15 LAB — LIPID PANEL
Cholesterol: 185 mg/dL (ref 0–200)
HDL: 35.4 mg/dL — ABNORMAL LOW (ref >39.00)
NonHDL: 149.81
Total CHOL/HDL Ratio: 5
Triglycerides: 372 mg/dL — ABNORMAL HIGH (ref 0.0–149.0)
VLDL: 74.4 mg/dL — ABNORMAL HIGH (ref 0.0–40.0)

## 2017-06-15 LAB — HEMOGLOBIN A1C: Hgb A1c MFr Bld: 9.1 % — ABNORMAL HIGH (ref 4.6–6.5)

## 2017-06-15 NOTE — Progress Notes (Signed)
Diabetes:  Using medications without difficulties:yes, taking 60 units per day Hypoglycemic episodes:no Hyperglycemic episodes:no Feet problems: Blood Sugars averaging: not checked often eye exam within last year: due, d/w pt.   Due for labs.  Pending.   He needs to check to see what kind of strips he needs.  He cut back on some carbs/foods.   We talked about him not coming back in for regular diabetes care.  He admitted he was noncompliant and said "if something goes wrong it's my (meaning the patient's) fault."  I will continue to give him good medical advice but he has to be willing to follow through and come in for visits so that we can check up on his situation.  I cannot make him compliant.  He understands this.  He is at risk of death/disability due to diabetes complications if he does not get this addressed, and he is aware of that.   Nagging R shoulder pain with ROM, still bowling and playing golf.  "I don't want to do anything about it yet."  D/w pt.  He'll update me as needed.  D/w pt about continued ROM exercises.    Meds, vitals, and allergies reviewed.  ROS: Per HPI unless specifically indicated in ROS section   GEN: nad, alert and oriented HEENT: mucous membranes moist NECK: supple w/o LA CV: rrr. PULM: ctab, no inc wob ABD: soft, +bs EXT: no edema  Diabetic foot exam: Normal inspection No skin breakdown No calluses  Normal DP pulses Normal sensation to light touch and monofilament Nails normal

## 2017-06-15 NOTE — Patient Instructions (Addendum)
Check and see what kind of meter/strips you need.  Let me know.   Call about an eye exam.  Go to the lab on the way out.  We'll contact you with your lab report. Keep the physical as scheduled.  Take care.  Glad to see you.

## 2017-06-16 NOTE — Assessment & Plan Note (Signed)
Due for labs.  Pending.   See notes on labs.   He needs to check to see what kind of strips he needs.  He cut back on some carbs/foods.   We talked about him not coming back in for regular diabetes care.  He admitted he was noncompliant and said "if something goes wrong it's my (meaning the patient's) fault."  I will continue to give him good medical advice but he has to be willing to follow through and come in for visits so that we can check up on his situation.  I cannot make him compliant.  He understands this.  He is at risk of death/disability due to diabetes complications if he does not get this addressed, and he is aware of that.  >25 minutes spent in face to face time with patient, >50% spent in counselling or coordination of care

## 2017-07-01 ENCOUNTER — Other Ambulatory Visit: Payer: Federal, State, Local not specified - PPO

## 2017-07-19 ENCOUNTER — Ambulatory Visit (INDEPENDENT_AMBULATORY_CARE_PROVIDER_SITE_OTHER): Payer: Federal, State, Local not specified - PPO | Admitting: Family Medicine

## 2017-07-19 ENCOUNTER — Encounter: Payer: Self-pay | Admitting: Family Medicine

## 2017-07-19 VITALS — BP 132/80 | HR 94 | Temp 98.4°F | Ht 72.0 in | Wt 216.8 lb

## 2017-07-19 DIAGNOSIS — Z0000 Encounter for general adult medical examination without abnormal findings: Secondary | ICD-10-CM | POA: Diagnosis not present

## 2017-07-19 DIAGNOSIS — Z7189 Other specified counseling: Secondary | ICD-10-CM

## 2017-07-19 DIAGNOSIS — E1165 Type 2 diabetes mellitus with hyperglycemia: Secondary | ICD-10-CM

## 2017-07-19 DIAGNOSIS — E785 Hyperlipidemia, unspecified: Secondary | ICD-10-CM

## 2017-07-19 MED ORDER — BASAGLAR KWIKPEN 100 UNIT/ML ~~LOC~~ SOPN: PEN_INJECTOR | SUBCUTANEOUS | 3 refills | Status: DC

## 2017-07-19 MED ORDER — LISINOPRIL 20 MG PO TABS: 20.0000 mg | ORAL_TABLET | Freq: Every day | ORAL | 3 refills | Status: DC

## 2017-07-19 MED ORDER — SIMVASTATIN 40 MG PO TABS: 40.0000 mg | ORAL_TABLET | Freq: Every day | ORAL | 3 refills | Status: DC

## 2017-07-19 MED ORDER — METFORMIN HCL 1000 MG PO TABS: ORAL_TABLET | ORAL | 3 refills | Status: DC

## 2017-07-19 MED ORDER — INSULIN PEN NEEDLE 31G X 5 MM MISC: 99 refills | Status: DC

## 2017-07-19 MED ORDER — OMEPRAZOLE 20 MG PO CPDR: 20.0000 mg | DELAYED_RELEASE_CAPSULE | Freq: Every day | ORAL | 3 refills | Status: AC

## 2017-07-19 NOTE — Patient Instructions (Addendum)
Get a meter and check your sugar and update me.   Recheck in 3 months.  The only lab you need to have done for your next diabetic visit is an A1c.  We can do this with a fingerstick test at the office visit.  You do not need a lab visit ahead of time for this.  It does not matter if you are fasting when the lab is done.   Take care.  Glad to see you.

## 2017-07-19 NOTE — Progress Notes (Signed)
CPE- See plan.  Routine anticipatory guidance given to patient.  See health maintenance.  The possibility exists that previously documented standard health maintenance information may have been brought forward from a previous encounter into this note.  If needed, that same information has been updated to reflect the current situation based on today's encounter.    Tetanus 2018 Flu encouraged.   PNA 2015 Shingles not due PSA and colon cancer screening not due, d/w pt.  Living will d/w pt.   Wife designated if patient were incapacitated.   Diet and exercise d/w pt.   Walking a lot with work  His bowling season just ended.  D/w pt about golf, etc.    Diabetes:  Using medications without difficulties:yes, taking 60 units per day.   Hypoglycemic episodes: no sx Hyperglycemic episodes: no sx Feet problems: no Blood Sugars averaging: not checked often. eye exam within last year: due, d/w pt.   Prev A1c up, d/w pt.   Prev TG up, d/w pt.  Needs DM2 tx to help with TGs.  D/w pt.  PMH and SH reviewed  Meds, vitals, and allergies reviewed.   ROS: Per HPI.  Unless specifically indicated otherwise in HPI, the patient denies:  General: fever. Eyes: acute vision changes ENT: sore throat Cardiovascular: chest pain Respiratory: SOB GI: vomiting GU: dysuria Musculoskeletal: acute back pain Derm: acute rash Neuro: acute motor dysfunction Psych: worsening mood Endocrine: polydipsia Heme: bleeding Allergy: hayfever  GEN: nad, alert and oriented HEENT: mucous membranes moist NECK: supple w/o LA CV: rrr. PULM: ctab, no inc wob ABD: soft, +bs EXT: no edema SKIN: no acute rash  Diabetic foot exam: Normal inspection No skin breakdown No calluses  Normal DP pulses Normal sensation to light touch and monofilament Nails normal

## 2017-07-20 DIAGNOSIS — Z7189 Other specified counseling: Secondary | ICD-10-CM | POA: Insufficient documentation

## 2017-07-20 NOTE — Assessment & Plan Note (Signed)
Previous labs discussed with patient.  No change in meds at this point.  He needs to get a meter.  Prescription done for meter with lancets and strips.  He needs to start checking his sugar daily.  He will likely need to adjust his insulin based on that but I need to know about his sugar readings first.  Continue work on diet and exercise.  We talked about compliance in general.  Routine cautions given to patient.  He understood.  See AVS.

## 2017-07-20 NOTE — Assessment & Plan Note (Signed)
Prev TG up, d/w pt.  Needs DM2 tx to help with TGs.  D/w pt.

## 2017-07-20 NOTE — Assessment & Plan Note (Signed)
Living will d/w pt.  Wife designated if patient were incapacitated.   ?

## 2017-07-20 NOTE — Assessment & Plan Note (Signed)
Tetanus 2018 Flu encouraged.   PNA 2015 Shingles not due PSA and colon cancer screening not due, d/w pt.  Living will d/w pt.   Wife designated if patient were incapacitated.   Diet and exercise d/w pt.   Walking a lot with work

## 2017-11-04 ENCOUNTER — Ambulatory Visit: Payer: Federal, State, Local not specified - PPO | Admitting: Family Medicine

## 2017-11-04 ENCOUNTER — Encounter: Payer: Self-pay | Admitting: Family Medicine

## 2017-11-04 VITALS — BP 136/72 | HR 92 | Temp 98.4°F | Wt 220.5 lb

## 2017-11-04 DIAGNOSIS — Z23 Encounter for immunization: Secondary | ICD-10-CM

## 2017-11-04 DIAGNOSIS — E1165 Type 2 diabetes mellitus with hyperglycemia: Secondary | ICD-10-CM | POA: Diagnosis not present

## 2017-11-04 DIAGNOSIS — Z794 Long term (current) use of insulin: Secondary | ICD-10-CM

## 2017-11-04 DIAGNOSIS — IMO0001 Reserved for inherently not codable concepts without codable children: Secondary | ICD-10-CM

## 2017-11-04 DIAGNOSIS — L989 Disorder of the skin and subcutaneous tissue, unspecified: Secondary | ICD-10-CM | POA: Diagnosis not present

## 2017-11-04 LAB — POCT GLYCOSYLATED HEMOGLOBIN (HGB A1C): Hemoglobin A1C: 10.4 % — AB (ref 4.0–5.6)

## 2017-11-04 NOTE — Patient Instructions (Addendum)
Please start checking your sugar at home.  My ability to help you is severely limited otherwise.   Take care.  Glad to see you.  Recheck in 3 months.   The only lab you need to have done for your next diabetic visit is an A1c.  We can do this with a fingerstick test at the office visit.  You do not need a lab visit ahead of time for this.  It does not matter if you are fasting when the lab is done.   Let me know about your in the meantime.

## 2017-11-04 NOTE — Progress Notes (Signed)
Diabetes:  Using medications without difficulties: 60 units insulin per day.  Taking metformin 1000mg  a day.   Hypoglycemic episodes: no sx Hyperglycemic episodes: no sx Feet problems: no tingling.   Blood Sugars averaging: not checked.  eye exam within last year: encouraged.   "I feel good and I don't want to see the numbers." he has a meter but isn't checking his sugar.  POC A1c done at [facility].   D/w pt at [facility].   Flu shot today.   Warty lesion on L medial shin, he wanted liq N2 tx.    Some throat irritation with recent weather changes, noted only today.  No fevers.  Pain with swallowing.  No cough.  Still working.    Meds, vitals, and allergies reviewed.  ROS: Per HPI unless specifically indicated in ROS section   GEN: nad, alert and oriented HEENT: mucous membranes moist, TM wnl, nasal and Op exam wnl NECK: supple w/o LA CV: rrr. PULM: ctab, no inc wob ABD: soft, +bs EXT: no edema SKIN: no acute rash but warty lesion on L medial shin, frozen x3 with liq N2.  Routine cautions d/w pt.  No complications.

## 2017-11-05 ENCOUNTER — Encounter: Payer: Self-pay | Admitting: Family Medicine

## 2017-11-05 MED ORDER — BASAGLAR KWIKPEN 100 UNIT/ML ~~LOC~~ SOPN: PEN_INJECTOR | SUBCUTANEOUS | 3 refills | Status: DC

## 2017-11-05 NOTE — Assessment & Plan Note (Signed)
Medically noncompliant.  Discussed with patient.  He does not check his sugar.  I told him that if he does not check his sugar my ability to help him is severely limited.  There is every expectation that he will have a bad outcome related to diabetes if he does not intervene and become more compliant.  He understands this.  I hope that he will eventually start checking his sugar and let me know about his readings.  He has a meter now.  See after visit summary.  Plan on recheck in 3 months.  His sore throat appears to be an incidental issue.  Benign exam.  He can update me as needed.

## 2017-11-05 NOTE — Assessment & Plan Note (Signed)
Warty lesion on the left medial shin frozen x3 with liquid nitrogen without any complication.  Tolerated well.  Routine cautions given.

## 2017-12-02 ENCOUNTER — Telehealth: Payer: Self-pay

## 2017-12-02 NOTE — Telephone Encounter (Signed)
Copied from CRM [id number]. Topic: General - Other >> Dec 02, 2017  9:51 AM Percival Spanish wrote:  [place] drug call to ask for a 90 day supply of the below med    Insulin Glargine (BASAGLAR KWIKPEN) 100 UNIT/ML SOPN

## 2017-12-03 NOTE — Telephone Encounter (Signed)
90 day supply with 1 year refills sent in on 11/05/17 to [facility].  I called and confirmed that they had received this rx and apologized for the miscommunication.

## 2018-01-27 ENCOUNTER — Encounter: Payer: Self-pay | Admitting: Family Medicine

## 2018-01-27 ENCOUNTER — Ambulatory Visit (INDEPENDENT_AMBULATORY_CARE_PROVIDER_SITE_OTHER): Payer: Federal, State, Local not specified - PPO | Admitting: Family Medicine

## 2018-01-27 VITALS — BP 120/78 | HR 88 | Temp 98.1°F | Ht 72.0 in | Wt 220.0 lb

## 2018-01-27 DIAGNOSIS — E1165 Type 2 diabetes mellitus with hyperglycemia: Secondary | ICD-10-CM

## 2018-01-27 DIAGNOSIS — E119 Type 2 diabetes mellitus without complications: Secondary | ICD-10-CM | POA: Diagnosis not present

## 2018-01-27 LAB — POCT GLYCOSYLATED HEMOGLOBIN (HGB A1C): Hemoglobin A1C: 9 % — AB (ref 4.0–5.6)

## 2018-01-27 NOTE — Progress Notes (Signed)
Diabetes:  Using medications without difficulties:yes Hypoglycemic episodes:no Hyperglycemic episodes:no Feet problems: no Blood Sugars averaging: lowest 130, averaging about 180-200.  eye exam within last year: due, d/w pt.   Recently increased to 75 units of insulin.  He has been working on diet.  He cut out sodas.    A1c d/w pt.  Improved but not at goal.    Meds, vitals, and allergies reviewed.   ROS: Per HPI unless specifically indicated in ROS section   GEN: nad, alert and oriented HEENT: mucous membranes moist NECK: supple w/o LA CV: rrr. PULM: ctab, no inc wob ABD: soft, +bs EXT: no edema SKIN: well perfused.

## 2018-01-27 NOTE — Patient Instructions (Addendum)
Thanks for your effort.   Call about an eye exam.  Keep adding a until until your sugars are consistently <150.  Take care.  Glad to see you.   Recheck in about 3 months.   The only lab you need to have done for your next diabetic visit is an A1c.  We can do this with a fingerstick test at the office visit.  You do not need a lab visit ahead of time for this.  It does not matter if you are fasting when the lab is done.

## 2018-01-30 NOTE — Assessment & Plan Note (Signed)
A1c improved but not at goal.  Discussed. He can call about an eye exam.  Keep adding a until until sugars are consistently <150.  Recheck in about 3 months. I thanked him for his effort. Continue work on diet and exercise.

## 2018-04-21 ENCOUNTER — Ambulatory Visit: Payer: Federal, State, Local not specified - PPO | Admitting: Family Medicine

## 2018-05-09 ENCOUNTER — Other Ambulatory Visit: Payer: Self-pay

## 2018-05-09 ENCOUNTER — Ambulatory Visit (INDEPENDENT_AMBULATORY_CARE_PROVIDER_SITE_OTHER): Payer: Federal, State, Local not specified - PPO | Admitting: Family Medicine

## 2018-05-09 DIAGNOSIS — E1165 Type 2 diabetes mellitus with hyperglycemia: Secondary | ICD-10-CM | POA: Diagnosis not present

## 2018-05-09 MED ORDER — BASAGLAR KWIKPEN 100 UNIT/ML ~~LOC~~ SOPN: PEN_INJECTOR | SUBCUTANEOUS | Status: DC

## 2018-05-09 MED ORDER — SITAGLIPTIN PHOSPHATE 100 MG PO TABS: 50.0000 mg | ORAL_TABLET | Freq: Every day | ORAL | 3 refills | Status: DC

## 2018-05-09 NOTE — Assessment & Plan Note (Signed)
Defer A1c for now.  Still with AM sugars >200.  Cautions d/w pt re: starting januvia.  He can price check that and update me if needed (if price prohibitive or about his sugar after starting med).  Continue other meds.  Plan on recheck in about 3 months.  He can call about f/u at that point.  He can taper insulin if needed with addition of januvia.  He agrees with plan.

## 2018-05-09 NOTE — Progress Notes (Signed)
This service was provided by telemedicine.  Location of patient: at home  Patient consented to encounter: yes Location of MD: [facility] Name of referring provider (if blank then none associated): Names per persons and role in encounter:  MD: Ferd Hibbs, Patient: name listed above.   Time on call: [id number]  CC: follow up  HPI: Diabetes:  Using medications without difficulties:yes Hypoglycemic episodes: only if prolonged fasting, cautions d/w pt Hyperglycemic episodes:no Feet problems: no Blood Sugars averaging: ~240 eye exam within last year: due, dw pt.   Has been taking 80 units insulin daily.  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   Diet is at baseline, encouraged low carb diet.    Meds and allergies reviewed.   ROS: Per HPI unless specifically indicated in ROS section   No physical exam.    Plan:   DM2.  Defer A1c for now.  Still with AM sugars >200.  Cautions d/w pt re: starting januvia.  He can price check that and update me if needed (if price prohibitive or about his sugar after starting med).  Continue other meds.  Plan on recheck in about 3 months.  He can call about f/u at that point.  He can taper insulin if needed with addition of januvia.  He agrees with plan.

## 2018-08-10 ENCOUNTER — Other Ambulatory Visit: Payer: Self-pay | Admitting: Family Medicine

## 2018-08-17 DIAGNOSIS — H6122 Impacted cerumen, left ear: Secondary | ICD-10-CM | POA: Diagnosis not present

## 2018-08-17 DIAGNOSIS — J342 Deviated nasal septum: Secondary | ICD-10-CM | POA: Diagnosis not present

## 2018-08-17 DIAGNOSIS — K219 Gastro-esophageal reflux disease without esophagitis: Secondary | ICD-10-CM | POA: Diagnosis not present

## 2018-08-26 ENCOUNTER — Other Ambulatory Visit: Payer: Self-pay | Admitting: Family Medicine

## 2018-08-26 NOTE — Telephone Encounter (Signed)
Spoke with patient and scheduled follow up visit with Dr. Damita Dunnings for DM.

## 2018-09-12 ENCOUNTER — Encounter: Payer: Self-pay | Admitting: Family Medicine

## 2018-09-12 ENCOUNTER — Other Ambulatory Visit: Payer: Self-pay

## 2018-09-12 ENCOUNTER — Ambulatory Visit: Payer: Federal, State, Local not specified - PPO | Admitting: Family Medicine

## 2018-09-12 VITALS — BP 106/74 | HR 91 | Temp 98.1°F | Ht 72.0 in | Wt 218.6 lb

## 2018-09-12 DIAGNOSIS — L989 Disorder of the skin and subcutaneous tissue, unspecified: Secondary | ICD-10-CM | POA: Diagnosis not present

## 2018-09-12 DIAGNOSIS — E1165 Type 2 diabetes mellitus with hyperglycemia: Secondary | ICD-10-CM

## 2018-09-12 DIAGNOSIS — M25569 Pain in unspecified knee: Secondary | ICD-10-CM | POA: Diagnosis not present

## 2018-09-12 DIAGNOSIS — Z1211 Encounter for screening for malignant neoplasm of colon: Secondary | ICD-10-CM

## 2018-09-12 DIAGNOSIS — E119 Type 2 diabetes mellitus without complications: Secondary | ICD-10-CM | POA: Diagnosis not present

## 2018-09-12 LAB — POCT GLYCOSYLATED HEMOGLOBIN (HGB A1C): Hemoglobin A1C: 8.9 % — AB (ref 4.0–5.6)

## 2018-09-12 NOTE — Patient Instructions (Addendum)
Call about an eye exam when possible.   Your A1c is still above goal.  If you vary the time when you check your sugar, I may be able to help more with your meds.  Please let me know.   Recheck in about 3 months with a physical.  Let the skin spot heal, don't pick at it, and send me a picture when it heals.  Work on side leg lifts and that may help the knee pain.   Take care.  Glad to see you.

## 2018-09-12 NOTE — Progress Notes (Signed)
Diabetes:  Using medications without difficulties: yes, down to 60 units on insulin.  Taking 50mg  januvia and 1000mg  metformin.  Hypoglycemic episodes:no Hyperglycemic episodes: 150 or higher Feet problems: no Blood Sugars averaging: 150 or higher eye exam within last year: d/w pt, encouraged.  A1c d/w pt, similar to prev.   He improved enough with januvia to lower his insulin.    D/w patient [id number] for colon cancer screening, including IFOB vs. Colonoscopy vs cologuard.  Risks and benefits of both were discussed and patient voiced understanding.  Pt elects to consider.    Skin lesion- see plan.  Previously noted above patient.  On the medial distal left shin.  He has been picking at it.  He has some L knee pain with standing after prolonged sitting, at the kneecap.    Pandemic and heat considerations d/w pt.   Meds, vitals, and allergies reviewed.   ROS: Per HPI unless specifically indicated in ROS section   GEN: nad, alert and oriented HEENT: mucous membranes moist NECK: supple w/o LA CV: rrr. PULM: ctab, no inc wob ABD: soft, +bs EXT: no edema SKIN: no acute rash but 51mm irritated round lesion on the medial distal L shin.  No ulceration.  No spreading erythema. L hip abductor weakness noted relative to quad.  L patella not ttp.  Normal knee ROM.   Diabetic foot exam: Normal inspection No skin breakdown No calluses  Normal DP pulses Normal sensation to light touch and monofilament Nails normal

## 2018-09-14 DIAGNOSIS — Z1211 Encounter for screening for malignant neoplasm of colon: Secondary | ICD-10-CM | POA: Insufficient documentation

## 2018-09-14 DIAGNOSIS — M25569 Pain in unspecified knee: Secondary | ICD-10-CM | POA: Insufficient documentation

## 2018-09-14 DIAGNOSIS — M79606 Pain in leg, unspecified: Secondary | ICD-10-CM | POA: Insufficient documentation

## 2018-09-14 NOTE — Assessment & Plan Note (Signed)
Symptoms are suggestive of patellofemoral syndrome and he has left hip abductor weakness relative to the quad.  He does a lot of straight ahead walking with his work and that may exacerbate his symptoms, by causing a relative quad strength versus the hip abductor.  Discussed side leg lifts and rationale for treatment.  He can decide leg lifts and update me as needed.  No imaging needed at this point. >25 minutes spent in face to face time with patient, >50% spent in counselling or coordination of care.

## 2018-09-14 NOTE — Assessment & Plan Note (Signed)
  D/w patient [id number] for colon cancer screening, including IFOB vs. Colonoscopy vs cologuard.  Risks and benefits of both were discussed and patient voiced understanding.  Pt elects to consider.

## 2018-09-14 NOTE — Assessment & Plan Note (Signed)
I asked him to stop picking at the lesion and let it heal over and then send me a picture of it.  We can make plans at that point.  He could have had an irritated seborrheic keratosis that he pulled off by picking.  This does not look like a typical issue with a squamous cell carcinoma or basal cell.  Discussed with patient.  He agrees.

## 2018-09-14 NOTE — Assessment & Plan Note (Signed)
A1c d/w pt, similar to prev.  He improved enough with januvia to lower his insulin.   He needs to continue work on diet and exercise.  I asked him to vary the time when he checks his sugars so he can update me about that.  We may be able to update his plan at that point, adjust his insulin.  See after visit summary.

## 2018-09-19 ENCOUNTER — Other Ambulatory Visit: Payer: Self-pay | Admitting: Family Medicine

## 2018-12-30 ENCOUNTER — Other Ambulatory Visit: Payer: Self-pay | Admitting: *Deleted

## 2018-12-30 MED ORDER — BASAGLAR KWIKPEN 100 UNIT/ML ~~LOC~~ SOPN: PEN_INJECTOR | SUBCUTANEOUS | 1 refills | Status: DC

## 2019-02-02 ENCOUNTER — Other Ambulatory Visit: Payer: Self-pay

## 2019-02-02 ENCOUNTER — Ambulatory Visit: Payer: Federal, State, Local not specified - PPO | Attending: Internal Medicine

## 2019-02-02 DIAGNOSIS — Z20822 Contact with and (suspected) exposure to covid-19: Secondary | ICD-10-CM

## 2019-02-04 LAB — NOVEL CORONAVIRUS, NAA: SARS-CoV-2, NAA: DETECTED — AB

## 2019-02-06 ENCOUNTER — Telehealth: Payer: Self-pay | Admitting: Nurse Practitioner

## 2019-02-06 NOTE — Telephone Encounter (Signed)
Called to Discuss with patient about Covid symptoms and the use of bamlanivimab, a monoclonal antibody infusion for those with mild to moderate Covid symptoms and at a high risk of hospitalization.     Pt is qualified for this infusion at the [facility] infusion center due to co-morbid conditions and/or a member of an at-risk group.    Patient Active Problem List   Diagnosis Date Noted  . Colon cancer screening 09/14/2018  . Knee pain 09/14/2018  . Advance care planning 07/20/2017  . Skin lesion 01/04/2014  . Routine general medical examination at a health care facility 01/07/2012  . Diabetes type 2, uncontrolled ([place]) 01/21/2010  . HLD (hyperlipidemia) 01/21/2010  . HYPERTENSION 01/21/2010  . GERD 01/21/2010      Patient would like to get infusion, but there are no slots available until Thursday. He will be out of the 10 day symptom window by Wednesday. Advised him that he would be placed on the cancellation list. If his symptoms worsen please go to urgent care or ED.

## 2019-02-07 ENCOUNTER — Ambulatory Visit (HOSPITAL_COMMUNITY)
Admission: RE | Admit: 2019-02-07 | Discharge: 2019-02-07 | Disposition: A | Discharge status: Home / Self Care | Payer: Federal, State, Local not specified - PPO | Source: Ambulatory Visit | Attending: Pulmonary Disease | Admitting: Pulmonary Disease

## 2019-02-07 ENCOUNTER — Other Ambulatory Visit: Payer: Self-pay | Admitting: Unknown Physician Specialty

## 2019-02-07 DIAGNOSIS — U071 COVID-19: Secondary | ICD-10-CM | POA: Diagnosis not present

## 2019-02-07 MED ORDER — DIPHENHYDRAMINE HCL 50 MG/ML IJ SOLN: 50.0000 mg | Freq: Once | INTRAMUSCULAR | Status: DC | PRN

## 2019-02-07 MED ORDER — FAMOTIDINE IN NACL 20-0.9 MG/50ML-% IV SOLN: 20.0000 mg | Freq: Once | INTRAVENOUS | Status: DC | PRN

## 2019-02-07 MED ORDER — BAMLANIVIMAB 700MG/20ML INJECTION
700.0000 mg | Freq: Once | INTRAMUSCULAR | Status: AC
  Administered 2019-02-07: 700 mg via INTRAVENOUS
  Filled 2019-02-07: qty 20

## 2019-02-07 MED ORDER — SODIUM CHLORIDE 0.9 % IV SOLN
INTRAVENOUS | Status: DC | PRN
  Administered 2019-02-07: 250 mL via INTRAVENOUS

## 2019-02-07 MED ORDER — METHYLPREDNISOLONE SODIUM SUCC 125 MG IJ SOLR: 125.0000 mg | Freq: Once | INTRAMUSCULAR | Status: DC | PRN

## 2019-02-07 MED ORDER — ALBUTEROL SULFATE HFA 108 (90 BASE) MCG/ACT IN AERS: 2.0000 | INHALATION_SPRAY | Freq: Once | RESPIRATORY_TRACT | Status: DC | PRN

## 2019-02-07 MED ORDER — EPINEPHRINE 0.3 MG/0.3ML IJ SOAJ: 0.3000 mg | Freq: Once | INTRAMUSCULAR | Status: DC | PRN

## 2019-02-07 NOTE — Discharge Instructions (Signed)
Prevent the Spread of COVID-19 if You Are Sick If you are sick with COVID-19 or think you might have COVID-19, follow the steps below to help protect other people in your home and community. Stay home except to get medical care.  Stay home. Most people with COVID-19 have mild illness and are able to recover at home without medical care. Do not leave your home, except to get medical care. Do not visit public areas.  Take care of yourself. Get rest and stay hydrated.  Get medical care when needed. Call your doctor before you go to their office for care. But, if you have trouble breathing or other concerning symptoms, call 911 for immediate help.  Avoid public transportation, ride-sharing, or taxis. Separate yourself from other people and pets in your home.  As much as possible, stay in a specific room and away from other people and pets in your home. Also, you should use a separate bathroom, if available. If you need to be around other people or animals in or outside of the home, wear a cloth face covering. ? See COVID-19 and Animals if you have questions about pets: https://www.cdc.gov/coronavirus/2019-ncov/faq.html#COVID19animals Monitor your symptoms.  Common symptoms of COVID-19 include fever and cough. Trouble breathing is a more serious symptom that means you should get medical attention.  Follow care instructions from your healthcare provider and local health department. Your local health authorities will give instructions on checking your symptoms and reporting information. If you develop emergency warning signs for COVID-19 get medical attention immediately.  Emergency warning signs include*:  Trouble breathing  Persistent pain or pressure in the chest  New confusion or not able to be woken  Bluish lips or face *This list is not all inclusive. Please consult your medical provider for any other symptoms that are severe or concerning to you. Call 911 if you have a medical  emergency. If you have a medical emergency and need to call 911, notify the operator that you have or think you might have, COVID-19. If possible, put on a facemask before medical help arrives. Call ahead before visiting your doctor.  Call ahead. Many medical visits for routine care are being postponed or done by phone or telemedicine.  If you have a medical appointment that cannot be postponed, call your doctor's office. This will help the office protect themselves and other patients. If you are sick, wear a cloth covering over your nose and mouth.  You should wear a cloth face covering over your nose and mouth if you must be around other people or animals, including pets (even at home).  You don't need to wear the cloth face covering if you are alone. If you can't put on a cloth face covering (because of trouble breathing for example), cover your coughs and sneezes in some other way. Try to stay at least 6 feet away from other people. This will help protect the people around you. Note: During the COVID-19 pandemic, medical grade facemasks are reserved for healthcare workers and some first responders. You may need to make a cloth face covering using a scarf or bandana. Cover your coughs and sneezes.  Cover your mouth and nose with a tissue when you cough or sneeze.  Throw used tissues in a lined trash can.  Immediately wash your hands with soap and water for at least 20 seconds. If soap and water are not available, clean your hands with an alcohol-based hand sanitizer that contains at least 60% alcohol. Clean your hands often.    Wash your hands often with soap and water for at least 20 seconds. This is especially important after blowing your nose, coughing, or sneezing; going to the bathroom; and before eating or preparing food.  Use hand sanitizer if soap and water are not available. Use an alcohol-based hand sanitizer with at least 60% alcohol, covering all surfaces of your hands and rubbing  them together until they feel dry.  Soap and water are the best option, especially if your hands are visibly dirty.  Avoid touching your eyes, nose, and mouth with unwashed hands. Avoid sharing personal household items.  Do not share dishes, drinking glasses, cups, eating utensils, towels, or bedding with other people in your home.  Wash these items thoroughly after using them with soap and water or put them in the dishwasher. Clean all "high-touch" surfaces everyday.  Clean and disinfect high-touch surfaces in your "sick room" and bathroom. Let someone else clean and disinfect surfaces in common areas, but not your bedroom and bathroom.  If a caregiver or other person needs to clean and disinfect a sick person's bedroom or bathroom, they should do so on an as-needed basis. The caregiver/other person should wear a mask and wait as long as possible after the sick person has used the bathroom. High-touch surfaces include phones, remote controls, counters, tabletops, doorknobs, bathroom fixtures, toilets, keyboards, tablets, and bedside tables.  Clean and disinfect areas that may have blood, stool, or body fluids on them.  Use household cleaners and disinfectants. Clean the area or item with soap and water or another detergent if it is dirty. Then use a household disinfectant. ? Be sure to follow the instructions on the label to ensure safe and effective use of the product. Many products recommend keeping the surface wet for several minutes to ensure germs are killed. Many also recommend precautions such as wearing gloves and making sure you have good ventilation during use of the product. ? Most EPA-registered household disinfectants should be effective. How to discontinue home isolation  People with COVID-19 who have stayed home (home isolated) can stop home isolation under the following conditions: ? If you will not have a test to determine if you are still contagious, you can leave home  after these three things have happened:  You have had no fever for at least 72 hours (that is three full days of no fever without the use of medicine that reduces fevers) AND  other symptoms have improved (for example, when your cough or shortness of breath has improved) AND  at least 10 days have passed since your symptoms first appeared. ? If you will be tested to determine if you are still contagious, you can leave home after these three things have happened:  You no longer have a fever (without the use of medicine that reduces fevers) AND  other symptoms have improved (for example, when your cough or shortness of breath has improved) AND  you received two negative tests in a row, 24 hours apart. Your doctor will follow CDC guidelines. In all cases, follow the guidance of your healthcare provider and local health department. The decision to stop home isolation should be made in consultation with your healthcare provider and state and local health departments. Local decisions depend on local circumstances. cdc.gov/coronavirus 06/19/2018 This information is not intended to replace advice given to you by your health care provider. Make sure you discuss any questions you have with your health care provider. Document Released: 05/31/2018 Document Revised: 06/29/2018 Document Reviewed: 05/31/2018   Elsevier Patient Education  2020 [employer]  COVID-19 COVID-19 is a respiratory infection that is caused by a virus called severe acute respiratory syndrome coronavirus 2 (SARS-CoV-2). The disease is also known as coronavirus disease or novel coronavirus. In some people, the virus may not cause any symptoms. In others, it may cause a serious infection. The infection can get worse quickly and can lead to complications, such as:  Pneumonia, or infection of the lungs.  Acute respiratory distress syndrome or ARDS. This is fluid build-up in the lungs.  Acute respiratory failure. This is a condition  in which there is not enough oxygen passing from the lungs to the body.  Sepsis or septic shock. This is a serious bodily reaction to an infection.  Blood clotting problems.  Secondary infections due to bacteria or fungus. The virus that causes COVID-19 is contagious. This means that it can spread from person to person through droplets from coughs and sneezes (respiratory secretions). What are the causes? This illness is caused by a virus. You may catch the virus by:  Breathing in droplets from an infected person's cough or sneeze.  Touching something, like a table or a doorknob, that was exposed to the virus (contaminated) and then touching your mouth, nose, or eyes. What increases the risk? Risk for infection You are more likely to be infected with this virus if you:  Live in or travel to an area with a COVID-19 outbreak.  Come in contact with a sick person who recently traveled to an area with a COVID-19 outbreak.  Provide care for or live with a person who is infected with COVID-19. Risk for serious illness You are more likely to become seriously ill from the virus if you:  Are 65 years of age or older.  Have a long-term disease that lowers your body's ability to fight infection (immunocompromised).  Live in a nursing home or long-term care facility.  Have a long-term (chronic) disease such as: ? Chronic lung disease, including chronic obstructive pulmonary disease or asthma ? Heart disease. ? Diabetes. ? Chronic kidney disease. ? Liver disease.  Are obese. What are the signs or symptoms? Symptoms of this condition can range from mild to severe. Symptoms may appear any time from 2 to 14 days after being exposed to the virus. They include:  A fever.  A cough.  Difficulty breathing.  Chills.  Muscle pains.  A sore throat.  Loss of taste or smell. Some people may also have stomach problems, such as nausea, vomiting, or diarrhea. Other people may not have any  symptoms of COVID-19. How is this diagnosed? This condition may be diagnosed based on:  Your signs and symptoms, especially if: ? You live in an area with a COVID-19 outbreak. ? You recently traveled to or from an area where the virus is common. ? You provide care for or live with a person who was diagnosed with COVID-19.  A physical exam.  Lab tests, which may include: ? A nasal swab to take a sample of fluid from your nose. ? A throat swab to take a sample of fluid from your throat. ? A sample of mucus from your lungs (sputum). ? Blood tests.  Imaging tests, which may include, X-rays, CT scan, or ultrasound. How is this treated? At present, there is no medicine to treat COVID-19. Medicines that treat other diseases are being used on a trial basis to see if they are effective against COVID-19. Your health care provider will talk with   you about ways to treat your symptoms. For most people, the infection is mild and can be managed at home with rest, fluids, and over-the-counter medicines. Treatment for a serious infection usually takes places in a hospital intensive care unit (ICU). It may include one or more of the following treatments. These treatments are given until your symptoms improve.  Receiving fluids and medicines through an IV.  Supplemental oxygen. Extra oxygen is given through a tube in the nose, a face mask, or a hood.  Positioning you to lie on your stomach (prone position). This makes it easier for oxygen to get into the lungs.  Continuous positive airway pressure (CPAP) or bi-level positive airway pressure (BPAP) machine. This treatment uses mild air pressure to keep the airways open. A tube that is connected to a motor delivers oxygen to the body.  Ventilator. This treatment moves air into and out of the lungs by using a tube that is placed in your windpipe.  Tracheostomy. This is a procedure to create a hole in the neck so that a breathing tube can be  inserted.  Extracorporeal membrane oxygenation (ECMO). This procedure gives the lungs a chance to recover by taking over the functions of the heart and lungs. It supplies oxygen to the body and removes carbon dioxide. Follow these instructions at home: Lifestyle  If you are sick, stay home except to get medical care. Your health care provider will tell you how long to stay home. Call your health care provider before you go for medical care.  Rest at home as told by your health care provider.  Do not use any products that contain nicotine or tobacco, such as cigarettes, e-cigarettes, and chewing tobacco. If you need help quitting, ask your health care provider.  Return to your normal activities as told by your health care provider. Ask your health care provider what activities are safe for you. General instructions  Take over-the-counter and prescription medicines only as told by your health care provider.  Drink enough fluid to keep your urine pale yellow.  Keep all follow-up visits as told by your health care provider. This is important. How is this prevented?  There is no vaccine to help prevent COVID-19 infection. However, there are steps you can take to protect yourself and others from this virus. To protect yourself:   Do not travel to areas where COVID-19 is a risk. The areas where COVID-19 is reported change often. To identify high-risk areas and travel restrictions, check the CDC travel website: wwwnc.cdc.gov/travel/notices  If you live in, or must travel to, an area where COVID-19 is a risk, take precautions to avoid infection. ? Stay away from people who are sick. ? Wash your hands often with soap and water for 20 seconds. If soap and water are not available, use an alcohol-based hand sanitizer. ? Avoid touching your mouth, face, eyes, or nose. ? Avoid going out in public, follow guidance from your state and local health authorities. ? If you must go out in public, wear a  cloth face covering or face mask. ? Disinfect objects and surfaces that are frequently touched every day. This may include:  Counters and tables.  Doorknobs and light switches.  Sinks and faucets.  Electronics, such as phones, remote controls, keyboards, computers, and tablets. To protect others: If you have symptoms of COVID-19, take steps to prevent the virus from spreading to others.  If you think you have a COVID-19 infection, contact your health care provider right away. Tell   your health care team that you think you may have a COVID-19 infection.  Stay home. Leave your house only to seek medical care. Do not use public transport.  Do not travel while you are sick.  Wash your hands often with soap and water for 20 seconds. If soap and water are not available, use alcohol-based hand sanitizer.  Stay away from other members of your household. Let healthy household members care for children and pets, if possible. If you have to care for children or pets, wash your hands often and wear a mask. If possible, stay in your own room, separate from others. Use a different bathroom.  Make sure that all people in your household wash their hands well and often.  Cough or sneeze into a tissue or your sleeve or elbow. Do not cough or sneeze into your hand or into the air.  Wear a cloth face covering or face mask. Where to find more information  Centers for Disease Control and Prevention: www.cdc.gov/coronavirus/2019-ncov/index.html  World Health Organization: www.who.int/health-topics/coronavirus Contact a health care provider if:  You live in or have traveled to an area where COVID-19 is a risk and you have symptoms of the infection.  You have had contact with someone who has COVID-19 and you have symptoms of the infection. Get help right away if:  You have trouble breathing.  You have pain or pressure in your chest.  You have confusion.  You have bluish lips and  fingernails.  You have difficulty waking from sleep.  You have symptoms that get worse. These symptoms may represent a serious problem that is an emergency. Do not wait to see if the symptoms will go away. Get medical help right away. Call your local emergency services (911 in the [place] Do not drive yourself to the hospital. Let the emergency medical personnel know if you think you have COVID-19. Summary  COVID-19 is a respiratory infection that is caused by a virus. It is also known as coronavirus disease or novel coronavirus. It can cause serious infections, such as pneumonia, acute respiratory distress syndrome, acute respiratory failure, or sepsis.  The virus that causes COVID-19 is contagious. This means that it can spread from person to person through droplets from coughs and sneezes.  You are more likely to develop a serious illness if you are 65 years of age or older, have a weak immunity, live in a nursing home, or have chronic disease.  There is no medicine to treat COVID-19. Your health care provider will talk with you about ways to treat your symptoms.  Take steps to protect yourself and others from infection. Wash your hands often and disinfect objects and surfaces that are frequently touched every day. Stay away from people who are sick and wear a mask if you are sick. This information is not intended to replace advice given to you by your health care provider. Make sure you discuss any questions you have with your health care provider. Document Released: 03/10/2018 Document Revised: 06/30/2018 Document Reviewed: 03/10/2018 Elsevier Patient Education  2020 Elsevier Inc.  

## 2019-02-07 NOTE — Progress Notes (Signed)
  Diagnosis: COVID-19  Physician: Dr. Wright  Procedure: [facility] Infusion Clinic Med: bamlanivimab infusion - Provided patient with bamlanimivab fact sheet for patients, parents and caregivers prior to infusion.  Complications: No immediate complications noted.  Discharge: Discharged home   Harry Whitaker 02/07/2019   

## 2019-02-15 ENCOUNTER — Telehealth: Payer: Self-pay | Admitting: *Deleted

## 2019-02-15 MED ORDER — BENZONATATE 200 MG PO CAPS: 200.0000 mg | ORAL_CAPSULE | Freq: Three times a day (TID) | ORAL | 1 refills | Status: DC | PRN

## 2019-02-15 NOTE — Telephone Encounter (Signed)
Sent tessalon.  If worse in the meantime, then either seek re-eval for call the triage line.  I hope he feels better soon.  Thanks.

## 2019-02-15 NOTE — Telephone Encounter (Signed)
Patient left a voicemail requesting that a cough medication be called in for him because Robitussin is not helping. Patient tested positive for covid 02/04/19. Called patient back and got his voicemail. Left message for patient to call back. When patient calls back need to get more information and name of pharmacy.

## 2019-02-15 NOTE — Telephone Encounter (Signed)
Patient called back   He stated he is needing a stronger medication for the cough, he has been taking OTC cough medication. And it does not seem to help with the cough. Patient stated he has some mucus when he coughs  Patient would like a script sent into the [facility]

## 2019-02-15 NOTE — Telephone Encounter (Signed)
Spoke to pt. He went to be tested for Covid on 12-17 and tested positive. He is on spot tracing contact with the health department.

## 2019-02-27 ENCOUNTER — Ambulatory Visit (INDEPENDENT_AMBULATORY_CARE_PROVIDER_SITE_OTHER): Payer: Federal, State, Local not specified - PPO | Admitting: Family Medicine

## 2019-02-27 ENCOUNTER — Other Ambulatory Visit: Payer: Self-pay

## 2019-02-27 ENCOUNTER — Encounter: Payer: Self-pay | Admitting: Family Medicine

## 2019-02-27 ENCOUNTER — Other Ambulatory Visit: Payer: Self-pay | Admitting: Family Medicine

## 2019-02-27 VITALS — BP 118/70 | HR 115 | Temp 97.6°F | Resp 16 | Ht 70.0 in | Wt 212.2 lb

## 2019-02-27 DIAGNOSIS — Z23 Encounter for immunization: Secondary | ICD-10-CM | POA: Diagnosis not present

## 2019-02-27 DIAGNOSIS — M720 Palmar fascial fibromatosis [Dupuytren]: Secondary | ICD-10-CM

## 2019-02-27 DIAGNOSIS — E785 Hyperlipidemia, unspecified: Secondary | ICD-10-CM

## 2019-02-27 DIAGNOSIS — R05 Cough: Secondary | ICD-10-CM

## 2019-02-27 DIAGNOSIS — I10 Essential (primary) hypertension: Secondary | ICD-10-CM

## 2019-02-27 DIAGNOSIS — E119 Type 2 diabetes mellitus without complications: Secondary | ICD-10-CM | POA: Diagnosis not present

## 2019-02-27 DIAGNOSIS — L989 Disorder of the skin and subcutaneous tissue, unspecified: Secondary | ICD-10-CM

## 2019-02-27 DIAGNOSIS — Z0000 Encounter for general adult medical examination without abnormal findings: Secondary | ICD-10-CM | POA: Diagnosis not present

## 2019-02-27 DIAGNOSIS — Z7189 Other specified counseling: Secondary | ICD-10-CM

## 2019-02-27 DIAGNOSIS — E1165 Type 2 diabetes mellitus with hyperglycemia: Secondary | ICD-10-CM

## 2019-02-27 DIAGNOSIS — R059 Cough, unspecified: Secondary | ICD-10-CM

## 2019-02-27 LAB — COMPREHENSIVE METABOLIC PANEL
ALT: 23 U/L (ref 0–53)
AST: 14 U/L (ref 0–37)
Albumin: 4.6 g/dL (ref 3.5–5.2)
Alkaline Phosphatase: 77 U/L (ref 39–117)
BUN: 13 mg/dL (ref 6–23)
CO2: 27 mEq/L (ref 19–32)
Calcium: 9.8 mg/dL (ref 8.4–10.5)
Chloride: 98 mEq/L (ref 96–112)
Creatinine, Ser: 0.97 mg/dL (ref 0.40–1.50)
GFR: 81.77 mL/min (ref >60.00)
Glucose, Bld: 178 mg/dL — ABNORMAL HIGH (ref 70–99)
Potassium: 4.2 mEq/L (ref 3.5–5.1)
Sodium: 134 mEq/L — ABNORMAL LOW (ref 135–145)
Total Bilirubin: 0.5 mg/dL (ref 0.2–1.2)
Total Protein: 7.9 g/dL (ref 6.0–8.3)

## 2019-02-27 LAB — LDL CHOLESTEROL, DIRECT: Direct LDL: 111 mg/dL

## 2019-02-27 LAB — LIPID PANEL
Cholesterol: 197 mg/dL (ref 0–200)
HDL: 37.9 mg/dL — ABNORMAL LOW (ref >39.00)
NonHDL: 159.26
Total CHOL/HDL Ratio: 5
Triglycerides: 388 mg/dL — ABNORMAL HIGH (ref 0.0–149.0)
VLDL: 77.6 mg/dL — ABNORMAL HIGH (ref 0.0–40.0)

## 2019-02-27 LAB — HEMOGLOBIN A1C: Hgb A1c MFr Bld: 9.1 % — ABNORMAL HIGH (ref 4.6–6.5)

## 2019-02-27 MED ORDER — BASAGLAR KWIKPEN 100 UNIT/ML ~~LOC~~ SOPN: PEN_INJECTOR | SUBCUTANEOUS | Status: DC

## 2019-02-27 MED ORDER — METFORMIN HCL 1000 MG PO TABS: 1000.0000 mg | ORAL_TABLET | Freq: Every day | ORAL | 3 refills | Status: DC

## 2019-02-27 MED ORDER — SIMVASTATIN 40 MG PO TABS: 40.0000 mg | ORAL_TABLET | Freq: Every day | ORAL | 3 refills | Status: DC

## 2019-02-27 MED ORDER — SITAGLIPTIN PHOSPHATE 100 MG PO TABS: 50.0000 mg | ORAL_TABLET | Freq: Every day | ORAL | 3 refills | Status: DC

## 2019-02-27 MED ORDER — ALBUTEROL SULFATE HFA 108 (90 BASE) MCG/ACT IN AERS: 1.0000 | INHALATION_SPRAY | Freq: Four times a day (QID) | RESPIRATORY_TRACT | 1 refills | Status: DC | PRN

## 2019-02-27 MED ORDER — LISINOPRIL 20 MG PO TABS: 20.0000 mg | ORAL_TABLET | Freq: Every day | ORAL | 3 refills | Status: DC

## 2019-02-27 NOTE — Patient Instructions (Addendum)
If you can't find your meter then check with your insurance and let me know what is covered.   Go to the lab on the way out.  We'll contact you with your lab report. Use albuterol as needed for cough.  Update me as needed.  If the skin spot doesn't heal over, then let me know.  Take care.  Glad to see you.

## 2019-02-27 NOTE — Progress Notes (Signed)
This visit occurred during the SARS-CoV-2 public health emergency.  Safety protocols were in place, including screening questions prior to the visit, additional usage of staff PPE, and extensive cleaning of exam room while observing appropriate contact time as indicated for disinfecting solutions.  CPE- See plan.  Routine anticipatory guidance given to patient.  See health maintenance.  The possibility exists that previously documented standard health maintenance information may have been brought forward from a previous encounter into this note.  If needed, that same information has been updated to reflect the current situation based on today's encounter.    Tetanus 2018 Flu today  PNA 2015 Shingles not due Prostate cancer screening and PSA options (with potential risks and benefits of testing vs not testing) were discussed along with recent recs/guidelines.  He declined testing PSA at this point. D/w patient [id number] for colon cancer screening, including IFOB vs. colonoscopy.  Risks and benefits of both were discussed and patient voiced understanding.  Pt elects to consider for now, he will update me when he makes a decision. Living will d/w pt.   Wife designated if patient were incapacitated.   Diet and exercise d/w pt.   Lesion present for a few months on the R upper eyelid, inferior to the brow.  Not painful.  No ulceration.  Not getting worse per patient report, may be getting some better.    Diabetes:  Using medications without difficulties: yes Hypoglycemic episodes: no sx Hyperglycemic episodes: no sx Feet problems: no Blood Sugars averaging: not checked often eye exam within last year: due, d/w pt.   He can't find his meter, d/w pt.  See avs.   Hypertension:    Using medication without problems or lightheadedness: yes Chest pain with exertion:no Edema:no Short of breath:no  Elevated Cholesterol: Using medications without problems: yes Muscle aches: no Diet  compliance:encouraged.  Exercise:encouraged.  Labs pending.    He has tendon thickening w/o pain on the B 5th rays on his palms.    Prev covid test positive, no sx now except for residual cough that is slowly getting better.  He is back to work now.  Cough is worse at night.  Sleep disrupted from cough.  No sputum usually, rare sputum.  No fevers.  Some occ wheeze.    PMH and SH reviewed Meds, vitals, and allergies reviewed.   ROS: Per HPI.  Unless specifically indicated otherwise in HPI, the patient denies:  General: fever. Eyes: acute vision changes ENT: sore throat Cardiovascular: chest pain Respiratory: SOB GI: vomiting GU: dysuria Musculoskeletal: acute back pain Derm: acute rash Neuro: acute motor dysfunction Psych: worsening mood Endocrine: polydipsia Heme: bleeding Allergy: hayfever  GEN: nad, alert and oriented HEENT: ncat NECK: supple w/o LA CV: rrr. PULM: ctab, no inc wob ABD: soft, +bs EXT: no edema SKIN: no acute rash but 2-3 mm faint pinkish macule noted on the right upper eyelid without ulceration. Bilateral fifth ray Dupuytren contracture noted without significant effect on range of motion on the hands.  Diabetic foot exam: Normal inspection No skin breakdown No calluses  Normal DP pulses Normal sensation to light touch and monofilament Nails normal

## 2019-03-01 DIAGNOSIS — R059 Cough, unspecified: Secondary | ICD-10-CM | POA: Insufficient documentation

## 2019-03-01 DIAGNOSIS — M720 Palmar fascial fibromatosis [Dupuytren]: Secondary | ICD-10-CM | POA: Insufficient documentation

## 2019-03-01 DIAGNOSIS — R05 Cough: Secondary | ICD-10-CM | POA: Insufficient documentation

## 2019-03-01 NOTE — Assessment & Plan Note (Signed)
Likely postinfectious cough as a residual from Covid infection.  Lungs are clear.  Still okay for outpatient follow-up.  He can use albuterol as needed and update me as needed.  He agrees.  Still okay for outpatient follow-up.  Should gradually resolve.  Discussed.

## 2019-03-01 NOTE — Assessment & Plan Note (Signed)
This looks benign.  It may be getting some small per patient report.  He will observe for now.  He will update me if it persists or gets larger.

## 2019-03-01 NOTE — Assessment & Plan Note (Signed)
Reasonable control.  He can update me if his pulse rate remains elevated.  See notes on labs.

## 2019-03-01 NOTE — Assessment & Plan Note (Signed)
No trouble with range of motion at this point.  If he has any difficulty with range of motion or other changes noted he can update me and we can refer him to the hand clinic.  Discussed.  He agrees.

## 2019-03-01 NOTE — Assessment & Plan Note (Signed)
Tetanus 2018 Flu today  PNA 2015 Shingles not due Prostate cancer screening and PSA options (with potential risks and benefits of testing vs not testing) were discussed along with recent recs/guidelines.  He declined testing PSA at this point. D/w patient PJ:[id number] for colon cancer screening, including IFOB vs. colonoscopy.  Risks and benefits of both were discussed and patient voiced understanding.  Pt elects to consider for now, he will update me when he makes a decision. Living will d/w pt.   Wife designated if patient were incapacitated.   Diet and exercise d/w pt.

## 2019-03-01 NOTE — Assessment & Plan Note (Signed)
No change in meds at this point.  See notes on labs. 

## 2019-03-01 NOTE — Assessment & Plan Note (Signed)
Living will d/w pt.  Wife designated if patient were incapacitated.   ?

## 2019-03-01 NOTE — Assessment & Plan Note (Signed)
See notes on labs.  See after visit summary.  He will check on getting a new meter/let me know which one he needs to have sent in.  Discussed diet and exercise.

## 2019-03-21 ENCOUNTER — Other Ambulatory Visit: Payer: Self-pay | Admitting: *Deleted

## 2019-03-21 NOTE — Telephone Encounter (Signed)
Faxed refill request. Albuterol Last office visit:   02/27/2019 Last Filled:    54 g 0 02/27/2019  Please advise.

## 2019-03-22 MED ORDER — ALBUTEROL SULFATE HFA 108 (90 BASE) MCG/ACT IN AERS: INHALATION_SPRAY | RESPIRATORY_TRACT | 0 refills | Status: DC

## 2019-03-22 NOTE — Telephone Encounter (Signed)
Spoke with patient and he states that he is doing better and is not needing this at this time. This was an automatic refill request from pharmacy.  FYI to PCP

## 2019-03-22 NOTE — Telephone Encounter (Signed)
Sent but please clarify his situation as this was recently filled.  Thanks.

## 2019-03-23 NOTE — Telephone Encounter (Signed)
Noted. Thanks.

## 2019-05-04 ENCOUNTER — Ambulatory Visit: Payer: Federal, State, Local not specified - PPO

## 2019-05-22 ENCOUNTER — Other Ambulatory Visit: Payer: Self-pay

## 2019-05-22 ENCOUNTER — Ambulatory Visit: Payer: Federal, State, Local not specified - PPO | Admitting: Family Medicine

## 2019-05-22 ENCOUNTER — Encounter: Payer: Self-pay | Admitting: Family Medicine

## 2019-05-22 VITALS — BP 112/70 | HR 94 | Temp 97.1°F | Ht 70.0 in | Wt 217.6 lb

## 2019-05-22 DIAGNOSIS — M79641 Pain in right hand: Secondary | ICD-10-CM

## 2019-05-22 DIAGNOSIS — E1165 Type 2 diabetes mellitus with hyperglycemia: Secondary | ICD-10-CM | POA: Diagnosis not present

## 2019-05-22 DIAGNOSIS — E119 Type 2 diabetes mellitus without complications: Secondary | ICD-10-CM

## 2019-05-22 LAB — POCT GLYCOSYLATED HEMOGLOBIN (HGB A1C): Hemoglobin A1C: 9.2 % — AB (ref 4.0–5.6)

## 2019-05-22 MED ORDER — SITAGLIPTIN PHOSPHATE 100 MG PO TABS: 100.0000 mg | ORAL_TABLET | Freq: Every day | ORAL | Status: DC

## 2019-05-22 NOTE — Progress Notes (Signed)
This visit occurred during the SARS-CoV-2 public health emergency.  Safety protocols were in place, including screening questions prior to the visit, additional usage of staff PPE, and extensive cleaning of exam room while observing appropriate contact time as indicated for disinfecting solutions.  Diabetes:  Using medications without difficulties: taking 50mg  .  Taking 60 units insulin.   He got a meter in the meantime.   Hypoglycemic episodes: no Hyperglycemic episodes: see below.  Feet problems: yes Blood Sugars averaging: checked in the AMs usually, averaging ~230 recently A1c done at [facility].  9.2  similar to prev.   He was charged by a dog at work and he had to reach back and slam a door shut with R hand.  Since then with pain along R 5th ray with extension. Event was a few weeks ago.  He has contract on R palm at baseline.  He still has some discomfort.  He was going to go by xray on the way out but that didn't get done.  We called him and he'll return for xray when possible.    Meds, vitals, and allergies reviewed.  ROS: Per HPI unless specifically indicated in ROS section   GEN: nad, alert and oriented HEENT: ncat NECK: supple w/o LA CV: rrr. PULM: ctab, no inc wob ABD: soft, +bs EXT: no edema SKIN: no acute rash but 79mm lesion on the L cheek, slightly irritated (possibly from shaving, d/wpt).  Prev lesion on R upper eyelid has faded.  No ulceration.   R hand with chronic changes noted at fifth ray Dupuytren contracture with some mild dec in permissible hyperextension at the R 5th MCP.  Slightly ttp along 5th ray.

## 2019-05-22 NOTE — Patient Instructions (Addendum)
Increase janvuia to 1 tab a day and let me know if your sugar isn't improving.   Take care.  Glad to see you. Recheck A1c in about 3 months at a visit.

## 2019-05-24 DIAGNOSIS — M79641 Pain in right hand: Secondary | ICD-10-CM | POA: Insufficient documentation

## 2019-05-24 NOTE — Assessment & Plan Note (Signed)
Unclear if pain is related to prev event and/or known contracture affecting ROM.  No severe dec in ROM and will check xray, see above.

## 2019-05-24 NOTE — Assessment & Plan Note (Signed)
Increase janvuia to 100mg  a day and he'll let me know if his sugar isn't improving.   Continue work on diet and exercise.  Recheck A1c in about 3 months at a visit.

## 2019-05-30 ENCOUNTER — Ambulatory Visit: Payer: Federal, State, Local not specified - PPO

## 2019-06-23 ENCOUNTER — Ambulatory Visit: Payer: Federal, State, Local not specified - PPO | Attending: Internal Medicine

## 2019-06-23 DIAGNOSIS — Z23 Encounter for immunization: Secondary | ICD-10-CM

## 2019-06-23 NOTE — Progress Notes (Signed)
   Covid-19 Vaccination Clinic  Name:  Harry Whitaker    MRN: [id number] DOB: September 07, 1970  06/23/2019  Harry Whitaker was observed post Covid-19 immunization for 15 minutes without incident. He was provided with Vaccine Information Sheet and instruction to access the V-Safe system.   Harry Whitaker was instructed to call 911 with any severe reactions post vaccine: Marland Kitchen Difficulty breathing  . Swelling of face and throat  . A fast heartbeat  . A bad rash all over body  . Dizziness and weakness   Immunizations Administered    Name Date Dose VIS Date Route   Pfizer COVID-19 Vaccine 06/23/2019 10:33 AM 0.3 mL 04/12/2018 Intramuscular   Manufacturer: [employer], [employer]   Lot: [id number]   NDC: [address]-4

## 2019-06-24 ENCOUNTER — Other Ambulatory Visit: Payer: Self-pay

## 2019-06-24 ENCOUNTER — Ambulatory Visit: Payer: Federal, State, Local not specified - PPO

## 2019-06-24 ENCOUNTER — Emergency Department: Payer: Federal, State, Local not specified - PPO

## 2019-06-24 ENCOUNTER — Encounter: Payer: Self-pay | Admitting: Emergency Medicine

## 2019-06-24 ENCOUNTER — Emergency Department
Admission: EM | Admit: 2019-06-24 | Discharge: 2019-06-24 | Disposition: A | Discharge status: Home / Self Care | Payer: Federal, State, Local not specified - PPO | Attending: Emergency Medicine | Admitting: Emergency Medicine

## 2019-06-24 DIAGNOSIS — R52 Pain, unspecified: Secondary | ICD-10-CM | POA: Insufficient documentation

## 2019-06-24 DIAGNOSIS — Z79899 Other long term (current) drug therapy: Secondary | ICD-10-CM | POA: Insufficient documentation

## 2019-06-24 DIAGNOSIS — E119 Type 2 diabetes mellitus without complications: Secondary | ICD-10-CM | POA: Diagnosis not present

## 2019-06-24 DIAGNOSIS — Z7984 Long term (current) use of oral hypoglycemic drugs: Secondary | ICD-10-CM | POA: Diagnosis not present

## 2019-06-24 DIAGNOSIS — I10 Essential (primary) hypertension: Secondary | ICD-10-CM | POA: Insufficient documentation

## 2019-06-24 DIAGNOSIS — T881XXA Other complications following immunization, not elsewhere classified, initial encounter: Secondary | ICD-10-CM | POA: Diagnosis not present

## 2019-06-24 DIAGNOSIS — R0602 Shortness of breath: Secondary | ICD-10-CM | POA: Diagnosis not present

## 2019-06-24 DIAGNOSIS — R509 Fever, unspecified: Secondary | ICD-10-CM | POA: Diagnosis not present

## 2019-06-24 LAB — URINALYSIS, COMPLETE (UACMP) WITH MICROSCOPIC
Bacteria, UA: NONE SEEN
Bilirubin Urine: NEGATIVE
Glucose, UA: >=500 mg/dL — AB
Hgb urine dipstick: NEGATIVE
Ketones, ur: 5 mg/dL — AB
Leukocytes,Ua: NEGATIVE
Nitrite: NEGATIVE
Protein, ur: 30 mg/dL — AB
Specific Gravity, Urine: 1.027 (ref 1.005–1.030)
pH: 5 (ref 5.0–8.0)

## 2019-06-24 LAB — COMPREHENSIVE METABOLIC PANEL
ALT: 19 U/L (ref 0–44)
AST: 18 U/L (ref 15–41)
Albumin: 4.3 g/dL (ref 3.5–5.0)
Alkaline Phosphatase: 71 U/L (ref 38–126)
Anion gap: 13 (ref 5–15)
BUN: 13 mg/dL (ref 6–20)
CO2: 21 mmol/L — ABNORMAL LOW (ref 22–32)
Calcium: 9.1 mg/dL (ref 8.9–10.3)
Chloride: 101 mmol/L (ref 98–111)
Creatinine, Ser: 0.9 mg/dL (ref 0.61–1.24)
GFR calc Af Amer: >60 mL/min (ref >60)
GFR calc non Af Amer: >60 mL/min (ref >60)
Glucose, Bld: 220 mg/dL — ABNORMAL HIGH (ref 70–99)
Potassium: 3.9 mmol/L (ref 3.5–5.1)
Sodium: 135 mmol/L (ref 135–145)
Total Bilirubin: 0.8 mg/dL (ref 0.3–1.2)
Total Protein: 7.7 g/dL (ref 6.5–8.1)

## 2019-06-24 LAB — CBC WITH DIFFERENTIAL/PLATELET
Abs Immature Granulocytes: 0.04 10*3/uL (ref 0.00–0.07)
Basophils Absolute: 0.1 10*3/uL (ref 0.0–0.1)
Basophils Relative: 1 %
Eosinophils Absolute: 0 10*3/uL (ref 0.0–0.5)
Eosinophils Relative: 1 %
HCT: 44.2 % (ref 39.0–52.0)
Hemoglobin: 15.3 g/dL (ref 13.0–17.0)
Immature Granulocytes: 1 %
Lymphocytes Relative: 12 %
Lymphs Abs: 1 10*3/uL (ref 0.7–4.0)
MCH: 26.7 pg (ref 26.0–34.0)
MCHC: 34.6 g/dL (ref 30.0–36.0)
MCV: 77.1 fL — ABNORMAL LOW (ref 80.0–100.0)
Monocytes Absolute: 0.8 10*3/uL (ref 0.1–1.0)
Monocytes Relative: 10 %
Neutro Abs: 6.6 10*3/uL (ref 1.7–7.7)
Neutrophils Relative %: 75 %
Platelets: 207 10*3/uL (ref 150–400)
RBC: 5.73 MIL/uL (ref 4.22–5.81)
RDW: 13 % (ref 11.5–15.5)
WBC: 8.6 10*3/uL (ref 4.0–10.5)
nRBC: 0 % (ref 0.0–0.2)

## 2019-06-24 LAB — LACTIC ACID, PLASMA
Lactic Acid, Venous: 2 mmol/L (ref 0.5–1.9)
Lactic Acid, Venous: 2.5 mmol/L (ref 0.5–1.9)

## 2019-06-24 MED ORDER — SODIUM CHLORIDE 0.9 % IV BOLUS
1000.0000 mL | Freq: Once | INTRAVENOUS | Status: AC
  Administered 2019-06-24: 11:00:00 1000 mL via INTRAVENOUS

## 2019-06-24 MED ORDER — ONDANSETRON HCL 4 MG/2ML IJ SOLN
4.0000 mg | Freq: Once | INTRAMUSCULAR | Status: AC
  Administered 2019-06-24: 11:00:00 4 mg via INTRAVENOUS
  Filled 2019-06-24: qty 2

## 2019-06-24 MED ORDER — KETOROLAC TROMETHAMINE 30 MG/ML IJ SOLN
30.0000 mg | Freq: Once | INTRAMUSCULAR | Status: AC
  Administered 2019-06-24: 11:00:00 30 mg via INTRAVENOUS
  Filled 2019-06-24: qty 1

## 2019-06-24 MED ORDER — SODIUM CHLORIDE 0.9 % IV BOLUS
1000.0000 mL | Freq: Once | INTRAVENOUS | Status: AC
  Administered 2019-06-24: 14:00:00 1000 mL via INTRAVENOUS

## 2019-06-24 NOTE — ED Provider Notes (Signed)
[facility] Emergency Department Provider Note   ____________________________________________   First MD Initiated Contact with Patient 06/24/19 1005     (approximate)  I have reviewed the triage vital signs and the nursing notes.   HISTORY  Chief Complaint Fever, Nausea, and Generalized Body Aches    HPI Harry Whitaker is a 49 y.o. male history diabetes hypertension hyperlipidemia and previous Covid infection in December   Patient reports he got his first Covid vaccination yesterday, not long after getting to the parking lot after his vaccination he started to feel little unusual.  Reports not long thereafter began experiencing feeling achiness, body aches, chills.  Last night felt sweaty, slightly short of breath.  Feeling nauseated no appetite.  Reports his symptoms feel very much like he is left muscle aches.  Had a low-grade fever  No rash.  No headache.  No chest pain.  Reports his symptoms feel a lot like when he had Covid actually, believes is likely related to being vaccinated as the symptoms started not long thereafter.  Past Medical History:  Diagnosis Date  . Diabetes mellitus   . GERD (gastroesophageal reflux disease)   . Hyperlipidemia   . Hypertension     Patient Active Problem List   Diagnosis Date Noted  . Right hand pain 05/24/2019  . Dupuytren contracture 03/01/2019  . Cough 03/01/2019  . Colon cancer screening 09/14/2018  . Knee pain 09/14/2018  . Advance care planning 07/20/2017  . Skin lesion 01/04/2014  . Routine general medical examination at a health care facility 01/07/2012  . Diabetes type 2, uncontrolled ([place]) 01/21/2010  . HLD (hyperlipidemia) 01/21/2010  . Essential hypertension 01/21/2010  . GERD 01/21/2010    Past Surgical History:  Procedure Laterality Date  . BACK SURGERY  2009   Herniated Disk    Prior to Admission medications   Medication Sig Start Date End Date Taking? Authorizing Provider    albuterol (VENTOLIN HFA) 108 (90 Base) MCG/ACT inhaler INHALE 1-2 PUFFS INTO THE LUNGS EVERY 6 HOURS AS NEEDED FOR COUGH Patient not taking: Reported on 05/22/2019 03/22/19   Tonia Ghent, MD  Insulin Glargine [facility]) 100 UNIT/ML SOPN Inject up to 60 units daily 02/27/19   Tonia Ghent, MD  Insulin Pen Needle (B-D UF III MINI PEN NEEDLES) 31G X 5 MM MISC USE DAILY WITH INSULIN PEN 08/11/18   Tonia Ghent, MD  lisinopril (ZESTRIL) 20 MG tablet Take 1 tablet (20 mg total) by mouth daily. 02/27/19   Tonia Ghent, MD  metFORMIN (GLUCOPHAGE) 1000 MG tablet Take 1 tablet (1,000 mg total) by mouth daily. 02/27/19   Tonia Ghent, MD  omeprazole (PRILOSEC) 20 MG capsule Take 1 capsule (20 mg total) by mouth daily. 07/19/17   Tonia Ghent, MD  simvastatin (ZOCOR) 40 MG tablet Take 1 tablet (40 mg total) by mouth at bedtime. 02/27/19   Tonia Ghent, MD  sitaGLIPtin (JANUVIA) 100 MG tablet Take 1 tablet (100 mg total) by mouth daily. 05/22/19   Tonia Ghent, MD    Allergies Metformin and related  Family History  Problem Relation Age of Onset  . Diabetes Mother   . Hyperlipidemia Mother   . Hypertension Mother   . Diabetes Father   . Heart disease Brother   . Colon cancer Paternal Grandfather   . Prostate cancer Neg Hx     Social History Social History   Tobacco Use  . Smoking status: Never Smoker  .  Smokeless tobacco: Never Used  Substance Use Topics  . Alcohol use: Yes    Alcohol/week: 0.0 standard drinks    Comment: Rare  . Drug use: No    Review of Systems Constitutional: See HPI Eyes: No visual changes. ENT: No sore throat. Cardiovascular: Denies chest pain. Respiratory: Slight feeling of shortness of breath Gastrointestinal: No abdominal pain.  Positive nausea Genitourinary: Negative for dysuria. Musculoskeletal: Generalized muscle aches Skin: Negative for rash. Neurological: Negative for headaches, areas of focal weakness or  numbness.    ____________________________________________   PHYSICAL EXAM:  VITAL SIGNS: ED Triage Vitals  Enc Vitals Group     BP 06/24/19 0944 (!) 152/86     Pulse Rate 06/24/19 0944 (!) 134     Resp 06/24/19 0944 16     Temp 06/24/19 0944 99.2 F (37.3 C)     Temp Source 06/24/19 0944 Oral     SpO2 06/24/19 0944 97 %     Weight 06/24/19 0943 217 lb 9.5 oz (98.7 kg)     Height 06/24/19 0943 5\' 10"  (1.778 m)     Head Circumference --      Peak Flow --      Pain Score 06/24/19 0942 6     Pain Loc --      Pain Edu? --      Excl. in GC? --     Constitutional: Alert and oriented.  Mildly ill-appearing, appears achy.  No acute distress though.   Eyes: Conjunctivae are normal. Head: Atraumatic. Nose: No congestion/rhinnorhea. Mouth/Throat: Mucous membranes are slightly dry. Neck: No stridor.  Cardiovascular: Moderately tachycardic rate, regular rhythm. Grossly normal heart sounds.  Good peripheral circulation. Respiratory: Normal respiratory effort.  No retractions. Lungs CTAB. Gastrointestinal: Soft and nontender. No distention. Musculoskeletal: No lower extremity tenderness nor edema.  Complains of myalgias in the shoulders with movement. Neurologic:  Normal speech and language. No gross focal neurologic deficits are appreciated.  Skin:  Skin is warm, dry and intact. No rash noted. Psychiatric: Mood and affect are normal. Speech and behavior are normal.  ____________________________________________   LABS (all labs ordered are listed, but only abnormal results are displayed)  Labs Reviewed  LACTIC ACID, PLASMA - Abnormal; Notable for the following components:      Result Value   Lactic Acid, Venous 2.0 (*)    All other components within normal limits  LACTIC ACID, PLASMA - Abnormal; Notable for the following components:   Lactic Acid, Venous 2.5 (*)    All other components within normal limits  COMPREHENSIVE METABOLIC PANEL - Abnormal; Notable for the following  components:   CO2 21 (*)    Glucose, Bld 220 (*)    All other components within normal limits  CBC WITH DIFFERENTIAL/PLATELET - Abnormal; Notable for the following components:   MCV 77.1 (*)    All other components within normal limits  URINALYSIS, COMPLETE (UACMP) WITH MICROSCOPIC - Abnormal; Notable for the following components:   Color, Urine YELLOW (*)    APPearance CLEAR (*)    Glucose, UA >=500 (*)    Ketones, ur 5 (*)    Protein, ur 30 (*)    All other components within normal limits   ____________________________________________  EKG  Reviewed interpreted by me at 1140 Heart rate 110 QRS 90 QTc 410 Sinus tachycardia no evidence acute ischemia ____________________________________________  RADIOLOGY  DG Chest 2 View  Result Date: 06/24/2019 CLINICAL DATA:  Shortness of breath EXAM: CHEST - 2 VIEW COMPARISON:  May 05, 2006 FINDINGS: Lungs are clear. There is an azygos lobe on the right, an anatomic variant. Heart size and pulmonary vascular normal. No adenopathy. No bone lesions. IMPRESSION: Lungs clear.  Cardiac silhouette within normal limits. Electronically Signed   By: Bretta Bang III M.D.   On: 06/24/2019 11:44    Chest x-ray reviewed negative for acute ____________________________________________   PROCEDURES  Procedure(s) performed: None  Procedures  Critical Care performed: No  ____________________________________________   INITIAL IMPRESSION / ASSESSMENT AND PLAN / ED COURSE  Pertinent labs & imaging results that were available during my care of the patient were reviewed by me and considered in my medical decision making (see chart for details).   Patient presents for myalgias, fatigue, nausea muscle aches some slight pain no shortness of breath generalized fatigue.  He has previously had Covid.  Temporal timeframe I suspect this is likely a side effect of his Covid vaccination.  He shows no signs or symptoms that would suggest anaphylaxis.  He  is slightly tachycardic, but this is improving with fluids and hydration.  General myalgias, no known recurrent Covid exposure that he is not febrile here in the ER.  See no signs of acute allergic reaction, no rash, no swelling, no wheezing, no acute GI symptoms or hypotension.  Chest x-ray reassuring.  Normal EKG.  No chest pain.  No pleuritic symptoms.   Clinical Course as of Jun 23 1548  Sat Jun 24, 2019  1403 Lactic acid trended up slightly after second liter of fluid, but patient reports that he feels much better.  His heart rate is improved, his respirations are 18, his oxygen level 98%, and he is resting quite comfortably.  Discussed with the patient, will give additional fluid bolus at this time, he reports that he is feeling much improved at this point though.  Appears improved.   [MQ]    Clinical Course User Index [MQ] Sharyn Creamer, MD   ----------------------------------------- 3:28 PM on 06/24/2019 -----------------------------------------  Patient reports feeling much improved.  He is resting quite comfortably heart rate 95.  Reports he is agreeable with plan for discharge and careful follow-up.  Suspicion that this is likely side effect from the vaccination, no evidence of acute infectious etiology.  His lactate trended slightly upward after the first liter of fluid, but after 2 L he appears well, vital signs normalized, resting comfortably reports much improvement.  Appears appropriate for outpatient follow-up and careful return precautions which I reviewed with him.  Return precautions and treatment recommendations and follow-up discussed with the patient who is agreeable with the plan.   ____________________________________________   FINAL CLINICAL IMPRESSION(S) / ED DIAGNOSES  Final diagnoses:  Post-vaccination reaction, initial encounter        Note:  This document was prepared using Dragon voice recognition software and may include unintentional dictation  errors       Sharyn Creamer, MD 06/24/19 1551

## 2019-06-24 NOTE — ED Notes (Signed)
Pt transported to XRay 

## 2019-06-24 NOTE — Discharge Instructions (Signed)
Believe you are likely experiencing a moderate side effects of receiving a Covid vaccination, but we do wish for you to be cautious and monitor yourself for worsening symptoms.  If you have worsening concerns or other concerns come up, please return to the emergency room.  If symptoms are from Covid vaccination, I would expect symptoms to improve quite a bit over the next 24 hours. This does not appear to be an "allergic reaction" but more consistent with side effects of the vaccine.  Contact a health care provider if: You have mild to moderate symptoms that don't get better by tomorrow Your symptoms come back after going away. Your symptoms get worse.  Get help right away if: You have trouble breathing. You have a severe headache or a stiff neck. You have severe vomiting or abdominal pain. Other concerns like rash, problems breathing, swelling, weakness Other concerns arise

## 2019-06-24 NOTE — ED Notes (Addendum)
Pt presents to the ED for generalized body aches, chills, fever, and constant nausea without vomiting. Pt recently got his COVID vaccine yesterday at 1000 and his symptoms started last night. Pt is A&Ox4 and NAD at this time.   Pt is ST on the monitor and tachypnic at this time.

## 2019-06-24 NOTE — ED Triage Notes (Signed)
Pt to ED via POV c/o body aches, fever, and nausea. Pt had first COVID shot yesterday, symptoms started last night. Pt is in NAD.

## 2019-06-24 NOTE — ED Notes (Signed)
Pt given apple juice and water with approval from Dr. Fanny Bien, MD. Pt tolerating well. Denies nausea at this time.

## 2019-06-24 NOTE — ED Notes (Signed)
Date and time results received: 06/24/19 1059  Test: Lactic Acid Critical Value: 2.0  Name of Provider Notified: Dr. Fanny Bien, MD

## 2019-07-05 ENCOUNTER — Ambulatory Visit: Payer: Self-pay | Admitting: *Deleted

## 2019-07-05 NOTE — Telephone Encounter (Signed)
I let him know the symptoms of having COVID-19 after the vaccine is a normal reaction.   Different people respond to the vaccine differently.   I do recommend you get the 2nd vaccine so you are fully protected.  He was agreeable to getting the 2nd vaccine.  "I just wanted to be sure it was ok to do so after having the reaction that I did".    Reason for Disposition . Health Information question, no triage required and triager able to answer question  Answer Assessment - Initial Assessment Questions 1. REASON FOR CALL or QUESTION: "What is your reason for calling today?" or "How can I best help you?" or "What question do you have that I can help answer?"     I had my first COVID-19 shot and had a reaction.  I felt terrible.  I felt like I had COVID-19 again.   Should I get the 2nd shot.   It was [employer].  Protocols used: INFORMATION ONLY CALL-A-AH

## 2019-07-18 ENCOUNTER — Ambulatory Visit: Payer: Federal, State, Local not specified - PPO

## 2019-07-19 DIAGNOSIS — K080 Exfoliation of teeth due to systemic causes: Secondary | ICD-10-CM | POA: Diagnosis not present

## 2019-07-21 ENCOUNTER — Other Ambulatory Visit: Payer: Self-pay | Admitting: Family Medicine

## 2019-07-31 ENCOUNTER — Emergency Department
Admission: EM | Admit: 2019-07-31 | Discharge: 2019-07-31 | Disposition: A | Discharge status: Home / Self Care | Payer: Federal, State, Local not specified - PPO | Attending: Student | Admitting: Student

## 2019-07-31 ENCOUNTER — Telehealth: Payer: Self-pay

## 2019-07-31 ENCOUNTER — Other Ambulatory Visit: Payer: Self-pay

## 2019-07-31 ENCOUNTER — Emergency Department: Payer: Federal, State, Local not specified - PPO

## 2019-07-31 DIAGNOSIS — I10 Essential (primary) hypertension: Secondary | ICD-10-CM | POA: Insufficient documentation

## 2019-07-31 DIAGNOSIS — J984 Other disorders of lung: Secondary | ICD-10-CM | POA: Diagnosis not present

## 2019-07-31 DIAGNOSIS — R531 Weakness: Secondary | ICD-10-CM

## 2019-07-31 DIAGNOSIS — R112 Nausea with vomiting, unspecified: Secondary | ICD-10-CM | POA: Insufficient documentation

## 2019-07-31 DIAGNOSIS — R6883 Chills (without fever): Secondary | ICD-10-CM | POA: Diagnosis not present

## 2019-07-31 DIAGNOSIS — E119 Type 2 diabetes mellitus without complications: Secondary | ICD-10-CM | POA: Insufficient documentation

## 2019-07-31 DIAGNOSIS — R197 Diarrhea, unspecified: Secondary | ICD-10-CM | POA: Insufficient documentation

## 2019-07-31 DIAGNOSIS — Z79899 Other long term (current) drug therapy: Secondary | ICD-10-CM | POA: Diagnosis not present

## 2019-07-31 DIAGNOSIS — R0602 Shortness of breath: Secondary | ICD-10-CM | POA: Insufficient documentation

## 2019-07-31 DIAGNOSIS — R1084 Generalized abdominal pain: Secondary | ICD-10-CM | POA: Insufficient documentation

## 2019-07-31 DIAGNOSIS — Z794 Long term (current) use of insulin: Secondary | ICD-10-CM | POA: Insufficient documentation

## 2019-07-31 LAB — BASIC METABOLIC PANEL
Anion gap: 11 (ref 5–15)
BUN: 18 mg/dL (ref 6–20)
CO2: 28 mmol/L (ref 22–32)
Calcium: 9.6 mg/dL (ref 8.9–10.3)
Chloride: 96 mmol/L — ABNORMAL LOW (ref 98–111)
Creatinine, Ser: 1.49 mg/dL — ABNORMAL HIGH (ref 0.61–1.24)
GFR calc Af Amer: >60 mL/min (ref >60)
GFR calc non Af Amer: 54 mL/min — ABNORMAL LOW (ref >60)
Glucose, Bld: 215 mg/dL — ABNORMAL HIGH (ref 70–99)
Potassium: 4.5 mmol/L (ref 3.5–5.1)
Sodium: 135 mmol/L (ref 135–145)

## 2019-07-31 LAB — CBC
HCT: 46.6 % (ref 39.0–52.0)
Hemoglobin: 16.8 g/dL (ref 13.0–17.0)
MCH: 27 pg (ref 26.0–34.0)
MCHC: 36.1 g/dL — ABNORMAL HIGH (ref 30.0–36.0)
MCV: 74.8 fL — ABNORMAL LOW (ref 80.0–100.0)
Platelets: 371 10*3/uL (ref 150–400)
RBC: 6.23 MIL/uL — ABNORMAL HIGH (ref 4.22–5.81)
RDW: 13.8 % (ref 11.5–15.5)
WBC: 14.3 10*3/uL — ABNORMAL HIGH (ref 4.0–10.5)
nRBC: 0 % (ref 0.0–0.2)

## 2019-07-31 LAB — LACTIC ACID, PLASMA: Lactic Acid, Venous: 1.8 mmol/L (ref 0.5–1.9)

## 2019-07-31 MED ORDER — SODIUM CHLORIDE 0.9 % IV BOLUS
1000.0000 mL | Freq: Once | INTRAVENOUS | Status: AC
  Administered 2019-07-31: 1000 mL via INTRAVENOUS

## 2019-07-31 MED ORDER — IOHEXOL 300 MG/ML  SOLN
100.0000 mL | Freq: Once | INTRAMUSCULAR | Status: AC | PRN
  Administered 2019-07-31: 100 mL via INTRAVENOUS
  Filled 2019-07-31: qty 100

## 2019-07-31 MED ORDER — IOHEXOL 9 MG/ML PO SOLN
500.0000 mL | Freq: Once | ORAL | Status: AC | PRN
  Administered 2019-07-31: 1000 mL via ORAL
  Filled 2019-07-31: qty 500

## 2019-07-31 MED ORDER — ONDANSETRON HCL 4 MG/2ML IJ SOLN
4.0000 mg | Freq: Once | INTRAMUSCULAR | Status: AC
  Administered 2019-07-31: 4 mg via INTRAVENOUS
  Filled 2019-07-31: qty 2

## 2019-07-31 MED ORDER — SODIUM CHLORIDE 0.9% FLUSH: 3.0000 mL | Freq: Once | INTRAVENOUS | Status: DC

## 2019-07-31 NOTE — Discharge Instructions (Addendum)
Thank you for letting us take care of you in the emergency department today.  ° °Please continue to take any regular, prescribed medications.  ° °Please follow up with: °- Your primary care doctor to review your ER visit and follow up on your symptoms.  ° °Please return to the ER for any new or worsening symptoms.  ° °

## 2019-07-31 NOTE — Telephone Encounter (Signed)
Noted. Will await the ER report.  Thanks.  

## 2019-07-31 NOTE — ED Triage Notes (Signed)
Pt comes via POV from home with c/o weakness. Pt states N/V/D and some belly pain that started couple days ago.  Pt states he thinks he may be dehydrated.

## 2019-07-31 NOTE — ED Provider Notes (Signed)
CT abdomen pelvis is negative for any acute abnormalities.  As such, will proceed with discharge as planned.  Updated patient on results.  Advised outpatient follow-up and given return precautions.   Miguel Aschoff., MD 07/31/19 [phone]

## 2019-07-31 NOTE — Telephone Encounter (Signed)
Pt walked in, nausea and vomiting on 07/25/19 and had 2 teeth extracted on 07/26/19. Since had teeth extracted pt has felt loss of all energy and fatigue; no appetite, chills, goes from feeling hot to cold; has not taken temp. Pt has not had vomiting since 07/25/19 but is continuously nauseated. Pt has been taking tylenol and advil since teeth extractions. Pt having upper and lower abd pain in the middle of stomach; may not be quite as bad this morning with abd pain. Pt has had watery diarrhea since 07/26/19.Marland Kitchen mouth is dry, voided small amt of urine this morning; did not notice color of urine. Pt is also lightheaded on and off with pressure behind eyes. Pt is also SOB upon exertion. Pt is in parking lot at [facility] and will go to [facility] ED. Pt feels like he can drive to [facility] now. Declined me calling 911 or family member to drive pt to [facility]. FYI to DR Para March.

## 2019-07-31 NOTE — ED Provider Notes (Signed)
[facility] Emergency Department Provider Note ____________________________________________   First MD Initiated Contact with Patient 07/31/19 1234     (approximate)  I have reviewed the triage vital signs and the nursing notes.   HISTORY  Chief Complaint Weakness    HPI Harry Whitaker is a 49 y.o. male with PMH as noted below including hypertension and diabetes who presents with multiple complaints.  Specifically, the patient has abdominal discomfort diffusely, associated with nausea and diarrhea over the last several days.  He states that 6 days ago he had several episodes of vomiting which started everything, but has not vomited since that night.  The patient also reports generalized weakness, some exertional shortness of breath, and subjective chills.  He states that 5 days ago he had dental work done and feels like the generalized symptoms started around that time.   Past Medical History:  Diagnosis Date  . Diabetes mellitus   . GERD (gastroesophageal reflux disease)   . Hyperlipidemia   . Hypertension     Patient Active Problem List   Diagnosis Date Noted  . Right hand pain 05/24/2019  . Dupuytren contracture 03/01/2019  . Cough 03/01/2019  . Colon cancer screening 09/14/2018  . Knee pain 09/14/2018  . Advance care planning 07/20/2017  . Skin lesion 01/04/2014  . Routine general medical examination at a health care facility 01/07/2012  . Diabetes type 2, uncontrolled (HCC) 01/21/2010  . HLD (hyperlipidemia) 01/21/2010  . Essential hypertension 01/21/2010  . GERD 01/21/2010    Past Surgical History:  Procedure Laterality Date  . BACK SURGERY  2009   Herniated Disk    Prior to Admission medications   Medication Sig Start Date End Date Taking? Authorizing Provider  albuterol (VENTOLIN HFA) 108 (90 Base) MCG/ACT inhaler INHALE 1-2 PUFFS INTO THE LUNGS EVERY 6 HOURS AS NEEDED FOR COUGH Patient not taking: Reported on 05/22/2019 03/22/19    Joaquim Nam, MD  Insulin Glargine [facility] KWIKPEN) 100 UNIT/ML INJECT UP TO 80 UNITS DAILY 07/21/19   Joaquim Nam, MD  Insulin Pen Needle (B-D UF III MINI PEN NEEDLES) 31G X 5 MM MISC USE DAILY WITH INSULIN PEN 08/11/18   Joaquim Nam, MD  lisinopril (ZESTRIL) 20 MG tablet Take 1 tablet (20 mg total) by mouth daily. 02/27/19   Joaquim Nam, MD  metFORMIN (GLUCOPHAGE) 1000 MG tablet Take 1 tablet (1,000 mg total) by mouth daily. 02/27/19   Joaquim Nam, MD  omeprazole (PRILOSEC) 20 MG capsule Take 1 capsule (20 mg total) by mouth daily. 07/19/17   Joaquim Nam, MD  simvastatin (ZOCOR) 40 MG tablet Take 1 tablet (40 mg total) by mouth at bedtime. 02/27/19   Joaquim Nam, MD  sitaGLIPtin (JANUVIA) 100 MG tablet Take 1 tablet (100 mg total) by mouth daily. 05/22/19   Joaquim Nam, MD    Allergies Metformin and related  Family History  Problem Relation Age of Onset  . Diabetes Mother   . Hyperlipidemia Mother   . Hypertension Mother   . Diabetes Father   . Heart disease Brother   . Colon cancer Paternal Grandfather   . Prostate cancer Neg Hx     Social History Social History   Tobacco Use  . Smoking status: Never Smoker  . Smokeless tobacco: Never Used  Vaping Use  . Vaping Use: Never used  Substance Use Topics  . Alcohol use: Yes    Alcohol/week: 0.0 standard drinks    Comment: Rare  .  Drug use: No    Review of Systems  Constitutional: Positive for weakness and subjective chills. Eyes: No redness. ENT: No sore throat. Cardiovascular: Denies chest pain. Respiratory: Positive for shortness of breath. Gastrointestinal: Positive for nausea and diarrhea. Genitourinary: Negative for dysuria.  Musculoskeletal: Negative for back pain. Skin: Negative for rash. Neurological: Negative for headache.   ____________________________________________   PHYSICAL EXAM:  VITAL SIGNS: ED Triage Vitals  Enc Vitals Group     BP 07/31/19 1204 117/61      Pulse Rate 07/31/19 1204 (!) 107     Resp 07/31/19 1204 18     Temp 07/31/19 1204 98.4 F (36.9 C)     Temp Source 07/31/19 1204 Oral     SpO2 07/31/19 1204 98 %     Weight --      Height --      Head Circumference --      Peak Flow --      Pain Score 07/31/19 1207 3     Pain Loc --      Pain Edu? --      Excl. in Arial? --     Constitutional: Alert and oriented. Well appearing and in no acute distress. Eyes: Conjunctivae are normal.  Head: Atraumatic. Nose: No congestion/rhinnorhea. Mouth/Throat: Mucous membranes are dry. Neck: Normal range of motion.  Cardiovascular: Normal rate, regular rhythm. Grossly normal heart sounds.  Good peripheral circulation. Respiratory: Normal respiratory effort.  No retractions. Lungs CTAB. Gastrointestinal: Soft with mild diffuse discomfort but no focal tenderness.  No distention.  Genitourinary: No flank tenderness. Musculoskeletal: No lower extremity edema.  Extremities warm and well perfused.  Neurologic:  Normal speech and language. No gross focal neurologic deficits are appreciated.  Skin:  Skin is warm and dry. No rash noted. Psychiatric: Mood and affect are normal. Speech and behavior are normal.  ____________________________________________   LABS (all labs ordered are listed, but only abnormal results are displayed)  Labs Reviewed  BASIC METABOLIC PANEL - Abnormal; Notable for the following components:      Result Value   Chloride 96 (*)    Glucose, Bld 215 (*)    Creatinine, Ser 1.49 (*)    GFR calc non Af Amer 54 (*)    All other components within normal limits  CBC - Abnormal; Notable for the following components:   WBC 14.3 (*)    RBC 6.23 (*)    MCV 74.8 (*)    MCHC 36.1 (*)    All other components within normal limits  CULTURE, BLOOD (ROUTINE X 2)  CULTURE, BLOOD (ROUTINE X 2)  LACTIC ACID, PLASMA  URINALYSIS, COMPLETE (UACMP) WITH MICROSCOPIC  LACTIC ACID, PLASMA  CBG MONITORING, ED    ____________________________________________  EKG  ED ECG REPORT I, Arta Silence, the attending physician, personally viewed and interpreted this ECG.  Date: 07/31/2019 EKG Time: 1206 Rate: 102 Rhythm: Sinus tachycardia QRS Axis: normal Intervals: normal ST/T Wave abnormalities: normal Narrative Interpretation: no evidence of acute ischemia  ____________________________________________  RADIOLOGY  CXR: No focal infiltrate or other acute abnormality  ____________________________________________   PROCEDURES  Procedure(s) performed: No  Procedures  Critical Care performed: **No ____________________________________________   INITIAL IMPRESSION / ASSESSMENT AND PLAN / ED COURSE  Pertinent labs & imaging results that were available during my care of the patient were reviewed by me and considered in my medical decision making (see chart for details).  49 year old male with PMH as noted above presents with multiple symptoms over approximately last 6 days.  He states that he initially had several episodes of vomiting, although this resolved the first night.  Since that time he has had diffuse abdominal pain, nausea, and loose stools.  He states that the symptoms have somewhat improved today.  5 days ago he had dental work done and then started to develop some chills and weakness.  His brother, who is an anesthesiologist, told him he was concerned he could have a blood infection although this would be rare.  On exam the patient is overall very well-appearing.  His vital signs are normal except for borderline tachycardia.  He is afebrile.  The physical exam is otherwise unremarkable.  He has mild diffuse abdominal discomfort but no focal tenderness or peritoneal signs.  The lungs are clear.  There is no edema.    Overall I suspect most likely viral gastroenteritis or other benign primary GI etiology which could explain all the symptoms.  Overall I have a very low  suspicion for bacteremia and the patient has no clinical evidence of sepsis other than the borderline heart rate.  He does appear slightly dehydrated.  We will obtain lab work-up including well as blood cultures and lactate, give a fluid bolus, obtain a urinalysis and chest x-ray, and reassess.  ----------------------------------------- 2:55 PM on 07/31/2019 -----------------------------------------  Lab work-up is remarkable only for leukocytosis.  The patient's lactate is normal.  Chemistry is unremarkable.  Chest x-ray shows no acute findings.  He continues to appear well.  There is no evidence of sepsis and I continue to have a very low clinical suspicion for bacteremia.  Based on shared decision making with the patient given the predominance of GI symptoms, the duration of his symptoms, and the elevated WBC count, we will obtain a CT abdomen to further evaluate.  If this is negative I anticipate discharge home.  I signed the patient out to the oncoming physician Dr. Colon Branch.  ____________________________________________   FINAL CLINICAL IMPRESSION(S) / ED DIAGNOSES  Final diagnoses:  Weakness  Generalized abdominal pain      NEW MEDICATIONS STARTED DURING THIS VISIT:  New Prescriptions   No medications on file     Note:  This document was prepared using Dragon voice recognition software and may include unintentional dictation errors.    Dionne Bucy, MD 07/31/19 1456

## 2019-08-04 ENCOUNTER — Ambulatory Visit: Payer: Federal, State, Local not specified - PPO | Attending: Internal Medicine

## 2019-08-04 DIAGNOSIS — Z23 Encounter for immunization: Secondary | ICD-10-CM

## 2019-08-04 NOTE — Progress Notes (Signed)
   Covid-19 Vaccination Clinic  Name:  Harry Whitaker    MRN: [id number] DOB: November 27, 1970  08/04/2019  [name] was observed post Covid-19 immunization for 15 minutes without incident. He was provided with Vaccine Information Sheet and instruction to access the V-Safe system.   Mr. Corona was instructed to call 911 with any severe reactions post vaccine: Marland Kitchen Difficulty breathing  . Swelling of face and throat  . A fast heartbeat  . A bad rash all over body  . Dizziness and weakness   Immunizations Administered    Name Date Dose VIS Date Route   Pfizer COVID-19 Vaccine 08/04/2019  9:02 AM 0.3 mL 04/12/2018 Intramuscular   Manufacturer: [employer], [employer]   Lot: [id number]   NDC: [address]-7

## 2019-08-05 LAB — CULTURE, BLOOD (ROUTINE X 2)
Culture: NO GROWTH
Culture: NO GROWTH

## 2019-08-20 ENCOUNTER — Other Ambulatory Visit: Payer: Self-pay | Admitting: Family Medicine

## 2020-02-16 ENCOUNTER — Other Ambulatory Visit: Payer: Self-pay | Admitting: Family Medicine

## 2020-03-07 ENCOUNTER — Other Ambulatory Visit: Payer: Self-pay

## 2020-03-07 ENCOUNTER — Encounter: Payer: Self-pay | Admitting: Emergency Medicine

## 2020-03-07 ENCOUNTER — Emergency Department
Admission: EM | Admit: 2020-03-07 | Discharge: 2020-03-07 | Disposition: A | Discharge status: Home / Self Care | Payer: Federal, State, Local not specified - PPO | Attending: Emergency Medicine | Admitting: Emergency Medicine

## 2020-03-07 DIAGNOSIS — X503XXA Overexertion from repetitive movements, initial encounter: Secondary | ICD-10-CM | POA: Diagnosis not present

## 2020-03-07 DIAGNOSIS — Z79899 Other long term (current) drug therapy: Secondary | ICD-10-CM | POA: Diagnosis not present

## 2020-03-07 DIAGNOSIS — Z794 Long term (current) use of insulin: Secondary | ICD-10-CM | POA: Diagnosis not present

## 2020-03-07 DIAGNOSIS — S86812A Strain of other muscle(s) and tendon(s) at lower leg level, left leg, initial encounter: Secondary | ICD-10-CM

## 2020-03-07 DIAGNOSIS — S86912A Strain of unspecified muscle(s) and tendon(s) at lower leg level, left leg, initial encounter: Secondary | ICD-10-CM | POA: Diagnosis not present

## 2020-03-07 DIAGNOSIS — Y9373 Activity, racquet and hand sports: Secondary | ICD-10-CM | POA: Diagnosis not present

## 2020-03-07 DIAGNOSIS — S8992XA Unspecified injury of left lower leg, initial encounter: Secondary | ICD-10-CM | POA: Diagnosis not present

## 2020-03-07 DIAGNOSIS — E119 Type 2 diabetes mellitus without complications: Secondary | ICD-10-CM | POA: Insufficient documentation

## 2020-03-07 DIAGNOSIS — Z7984 Long term (current) use of oral hypoglycemic drugs: Secondary | ICD-10-CM | POA: Insufficient documentation

## 2020-03-07 DIAGNOSIS — I10 Essential (primary) hypertension: Secondary | ICD-10-CM | POA: Insufficient documentation

## 2020-03-07 DIAGNOSIS — E785 Hyperlipidemia, unspecified: Secondary | ICD-10-CM | POA: Diagnosis not present

## 2020-03-07 NOTE — ED Triage Notes (Signed)
First Nurse Note:  C/O a popping sound to left calf while playing pickle ball.  Pain worse with movement.

## 2020-03-07 NOTE — ED Provider Notes (Signed)
[facility] Emergency Department Provider Note  ____________________________________________  Time seen: Approximately 6:56 PM  I have reviewed the triage vital signs and the nursing notes.   HISTORY  Chief Complaint Leg Injury    HPI Harry Whitaker is a 50 y.o. male that presents to the emergency department for evaluation of left calf injury.  Placement was playing pickle ball when he stepped and heard a pop to the back of his calf.  He immediately had difficulty walking after.  He is now able to walk but it is painful.    Past Medical History:  Diagnosis Date  . Diabetes mellitus   . GERD (gastroesophageal reflux disease)   . Hyperlipidemia   . Hypertension     Patient Active Problem List   Diagnosis Date Noted  . Right hand pain 05/24/2019  . Dupuytren contracture 03/01/2019  . Cough 03/01/2019  . Colon cancer screening 09/14/2018  . Knee pain 09/14/2018  . Advance care planning 07/20/2017  . Skin lesion 01/04/2014  . Routine general medical examination at a health care facility 01/07/2012  . Diabetes type 2, uncontrolled (HCC) 01/21/2010  . HLD (hyperlipidemia) 01/21/2010  . Essential hypertension 01/21/2010  . GERD 01/21/2010    Past Surgical History:  Procedure Laterality Date  . BACK SURGERY  2009   Herniated Disk    Prior to Admission medications   Medication Sig Start Date End Date Taking? Authorizing Provider  albuterol (VENTOLIN HFA) 108 (90 Base) MCG/ACT inhaler INHALE 1-2 PUFFS INTO THE LUNGS EVERY 6 HOURS AS NEEDED FOR COUGH Patient not taking: Reported on 05/22/2019 03/22/19   Joaquim Nam, MD  B-D UF III MINI PEN NEEDLES 31G X 5 MM MISC USE DAILY WITH INSULIN PEN 08/22/19   Joaquim Nam, MD  Insulin Glargine (BASAGLAR KWIKPEN) 100 UNIT/ML INJECT UP TO 80 UNITS DAILY 07/21/19   Joaquim Nam, MD  JANUVIA 100 MG tablet TAKE 1/2 TO 1 TABLET(50 TO 100 MG) BY MOUTH DAILY 02/19/20   Joaquim Nam, MD  lisinopril (ZESTRIL) 20 MG  tablet TAKE 1 TABLET(20 MG) BY MOUTH DAILY 02/19/20   Joaquim Nam, MD  metFORMIN (GLUCOPHAGE) 1000 MG tablet TAKE 1 TABLET(1000 MG) BY MOUTH DAILY 02/19/20   Joaquim Nam, MD  omeprazole (PRILOSEC) 20 MG capsule Take 1 capsule (20 mg total) by mouth daily. 07/19/17   Joaquim Nam, MD  simvastatin (ZOCOR) 40 MG tablet TAKE 1 TABLET(40 MG) BY MOUTH AT BEDTIME 02/19/20   Joaquim Nam, MD    Allergies Metformin and related  Family History  Problem Relation Age of Onset  . Diabetes Mother   . Hyperlipidemia Mother   . Hypertension Mother   . Diabetes Father   . Heart disease Brother   . Colon cancer Paternal Grandfather   . Prostate cancer Neg Hx     Social History Social History   Tobacco Use  . Smoking status: Never Smoker  . Smokeless tobacco: Never Used  Vaping Use  . Vaping Use: Never used  Substance Use Topics  . Alcohol use: Yes    Alcohol/week: 0.0 standard drinks    Comment: Rare  . Drug use: No     Review of Systems  Cardiovascular: No chest pain. Respiratory: No SOB. Gastrointestinal: No abdominal pain.  No nausea, no vomiting.  Musculoskeletal: Positive for calf pain. Skin: Negative for rash, abrasions, lacerations, ecchymosis. Neurological: Negative for headaches   ____________________________________________   PHYSICAL EXAM:  VITAL SIGNS: ED Triage Vitals  Enc Vitals Group     BP --      Pulse --      Resp --      Temp --      Temp src --      SpO2 --      Weight 03/07/20 1726 217 lb 9.5 oz (98.7 kg)     Height 03/07/20 1726 5\' 10"  (1.778 m)     Head Circumference --      Peak Flow --      Pain Score 03/07/20 1725 8     Pain Loc --      Pain Edu? --      Excl. in GC? --      Constitutional: Alert and oriented. Well appearing and in no acute distress. Eyes: Conjunctivae are normal. PERRL. EOMI. Head: Atraumatic. ENT:      Ears:      Nose: No congestion/rhinnorhea.      Mouth/Throat: Mucous membranes are moist.  Neck: No  stridor.   Cardiovascular: Normal rate, regular rhythm.  Good peripheral circulation. Respiratory: Normal respiratory effort without tachypnea or retractions. Lungs CTAB. Good air entry to the bases with no decreased or absent breath sounds. Musculoskeletal: Full range of motion to all extremities. No gross deformities appreciated. Tenderness to palpation to left mid calf. No erythema or ecchymosis. Full ROM of left knee and ankle. Achilles tendon intact. Neurologic:  Normal speech and language. No gross focal neurologic deficits are appreciated.  Skin:  Skin is warm, dry and intact. No rash noted. Psychiatric: Mood and affect are normal. Speech and behavior are normal. Patient exhibits appropriate insight and judgement.   ____________________________________________   LABS (all labs ordered are listed, but only abnormal results are displayed)  Labs Reviewed - No data to display ____________________________________________  EKG   ____________________________________________  RADIOLOGY   No results found.  ____________________________________________    PROCEDURES  Procedure(s) performed:    Procedures    Medications - No data to display   ____________________________________________   INITIAL IMPRESSION / ASSESSMENT AND PLAN / ED COURSE  Pertinent labs & imaging results that were available during my care of the patient were reviewed by me and considered in my medical decision making (see chart for details).  Review of the [place] CSRS was performed in accordance of the [employer] prior to dispensing any controlled drugs.   Patient's diagnosis is consistent with calf tear. Vital signs and exam are reassuring. Boot was placed. Crutches were given. Patient is to follow up with PCP as directed. Patient is given ED precautions to return to the ED for any worsening or new symptoms.  Harry Whitaker was evaluated in Emergency Department on 03/07/2020 for the symptoms described in the  history of present illness. He was evaluated in the context of the global COVID-19 pandemic, which necessitated consideration that the patient might be at risk for infection with the SARS-CoV-2 virus that causes COVID-19. Institutional protocols and algorithms that pertain to the evaluation of patients at risk for COVID-19 are in a state of rapid change based on information released by regulatory bodies including the CDC and federal and state organizations. These policies and algorithms were followed during the patient's care in the ED.   ____________________________________________  FINAL CLINICAL IMPRESSION(S) / ED DIAGNOSES  Final diagnoses:  Strain of left calf muscle      NEW MEDICATIONS STARTED DURING THIS VISIT:  ED Discharge Orders    None          This  chart was dictated using voice recognition software/Dragon. Despite best efforts to proofread, errors can occur which can change the meaning. Any change was purely unintentional.    Enid Derry, PA-C 03/07/20 2310    Chesley Noon, MD 03/11/20 [phone]

## 2020-03-08 ENCOUNTER — Other Ambulatory Visit: Payer: Self-pay

## 2020-03-08 ENCOUNTER — Telehealth (INDEPENDENT_AMBULATORY_CARE_PROVIDER_SITE_OTHER): Payer: Federal, State, Local not specified - PPO | Admitting: Family Medicine

## 2020-03-08 ENCOUNTER — Encounter: Payer: Self-pay | Admitting: Family Medicine

## 2020-03-08 VITALS — Ht 70.0 in | Wt 210.0 lb

## 2020-03-08 DIAGNOSIS — M79606 Pain in leg, unspecified: Secondary | ICD-10-CM

## 2020-03-08 DIAGNOSIS — E1165 Type 2 diabetes mellitus with hyperglycemia: Secondary | ICD-10-CM | POA: Diagnosis not present

## 2020-03-08 NOTE — Progress Notes (Signed)
Patient pulled muscle in calf while playing pickle ball yesterday. Did go to ED yesterday to be seen; wants him to see Ortho. Patient states leg is very tight and tender and can barely walk. Patient has been taking ibuprofen and applied ice to leg last night.

## 2020-03-08 NOTE — Progress Notes (Signed)
Virtual visit completed through WebEx or similar program Patient location: home  Provider location: [place] at [facility], office  Participants: Patient and me (unless stated otherwise below)  Pandemic considerations d/w pt.   Limitations and rationale for visit method d/w patient.  Patient agreed to proceed.   CC: leg pain.   HPI:  Patient pulled muscle in L calf while playing pickle ball yesterday. Did go to ED yesterday to be seen; he felt a pop in his calf. Patient states leg is very tight in the calf and tender and can barely walk. Patient has been taking ibuprofen and applied ice to leg last night.  dx/d with calf tear.  No bruising but he has local swelling.  Taking tylenol and ibuprofen.  Pain is better today.  He has boot and crutches.  He used ice last night.  Discussed elevation.   Ortho referral placed.  He has pain with hard plantarflexion but has ROM intact o/w.    He is due for A1c when possible.  He'll call about DM2 f/u.  He is taking 60 units of insulin, 100mg  januvia, metformin.    Meds and allergies reviewed.   ROS: Per HPI unless specifically indicated in ROS section   NAD Speech wnl  A/P:  Calf strain versus tear.  Not having pain when he is at rest.  He still has intact plantarflexion and dorsiflexion of the foot.  Discussed using a boot, crutches, icing, elevation, and referral to orthopedics already placed.  Routine cautions given to patient.  He agrees.  Discussed with patient about diabetes follow-up.  He will call to schedule follow-up.

## 2020-03-10 NOTE — Assessment & Plan Note (Signed)
   Discussed with patient about diabetes follow-up.  He will call to schedule follow-up.

## 2020-03-10 NOTE — Assessment & Plan Note (Signed)
Calf strain versus tear.  Not having pain when he is at rest.  He still has intact plantarflexion and dorsiflexion of the foot.  Discussed using a boot, crutches, icing, elevation, and referral to orthopedics already placed.  Routine cautions given to patient.  He agrees.

## 2020-03-11 ENCOUNTER — Telehealth: Payer: Self-pay | Admitting: Family Medicine

## 2020-03-11 DIAGNOSIS — M66879 Spontaneous rupture of other tendons, unspecified ankle and foot: Secondary | ICD-10-CM | POA: Diagnosis not present

## 2020-03-11 NOTE — Telephone Encounter (Signed)
Called patient regarding his Ortho Referral - he stated that he saw [facility] this morning 03/11/20 at 10am - Dr Martha Clan Pt states that they did some xrays - diagnosed as a torn plantaris muscle.  Healing time frame a couple weeks. Pt is starting PT this week and will do a few sessions and will follow back up with him.   Patient wanted to make Dr Para March aware.

## 2020-03-11 NOTE — Telephone Encounter (Signed)
Pt called stated that he went to the orthopedic doctor and wanted to let him know the update

## 2020-03-12 NOTE — Telephone Encounter (Signed)
Noted. Thanks.

## 2020-03-15 DIAGNOSIS — M66879 Spontaneous rupture of other tendons, unspecified ankle and foot: Secondary | ICD-10-CM | POA: Diagnosis not present

## 2020-03-18 DIAGNOSIS — M66879 Spontaneous rupture of other tendons, unspecified ankle and foot: Secondary | ICD-10-CM | POA: Diagnosis not present

## 2020-03-27 DIAGNOSIS — M66879 Spontaneous rupture of other tendons, unspecified ankle and foot: Secondary | ICD-10-CM | POA: Diagnosis not present

## 2020-03-29 DIAGNOSIS — M66879 Spontaneous rupture of other tendons, unspecified ankle and foot: Secondary | ICD-10-CM | POA: Diagnosis not present

## 2020-04-03 DIAGNOSIS — M66879 Spontaneous rupture of other tendons, unspecified ankle and foot: Secondary | ICD-10-CM | POA: Diagnosis not present

## 2020-04-05 DIAGNOSIS — M66879 Spontaneous rupture of other tendons, unspecified ankle and foot: Secondary | ICD-10-CM | POA: Diagnosis not present

## 2020-04-09 DIAGNOSIS — M66879 Spontaneous rupture of other tendons, unspecified ankle and foot: Secondary | ICD-10-CM | POA: Diagnosis not present

## 2020-04-11 DIAGNOSIS — M66879 Spontaneous rupture of other tendons, unspecified ankle and foot: Secondary | ICD-10-CM | POA: Diagnosis not present

## 2020-04-16 ENCOUNTER — Other Ambulatory Visit: Payer: Self-pay | Admitting: Family Medicine

## 2020-07-30 ENCOUNTER — Other Ambulatory Visit: Payer: Self-pay | Admitting: Family Medicine

## 2020-09-16 IMAGING — CR DG CHEST 2V
2 series · 2 of 2 positions shown · non-contrast
Comparison: May 05, 2006

CLINICAL DATA: Shortness of breath

EXAM:
CHEST - 2 VIEW

[chest pa]
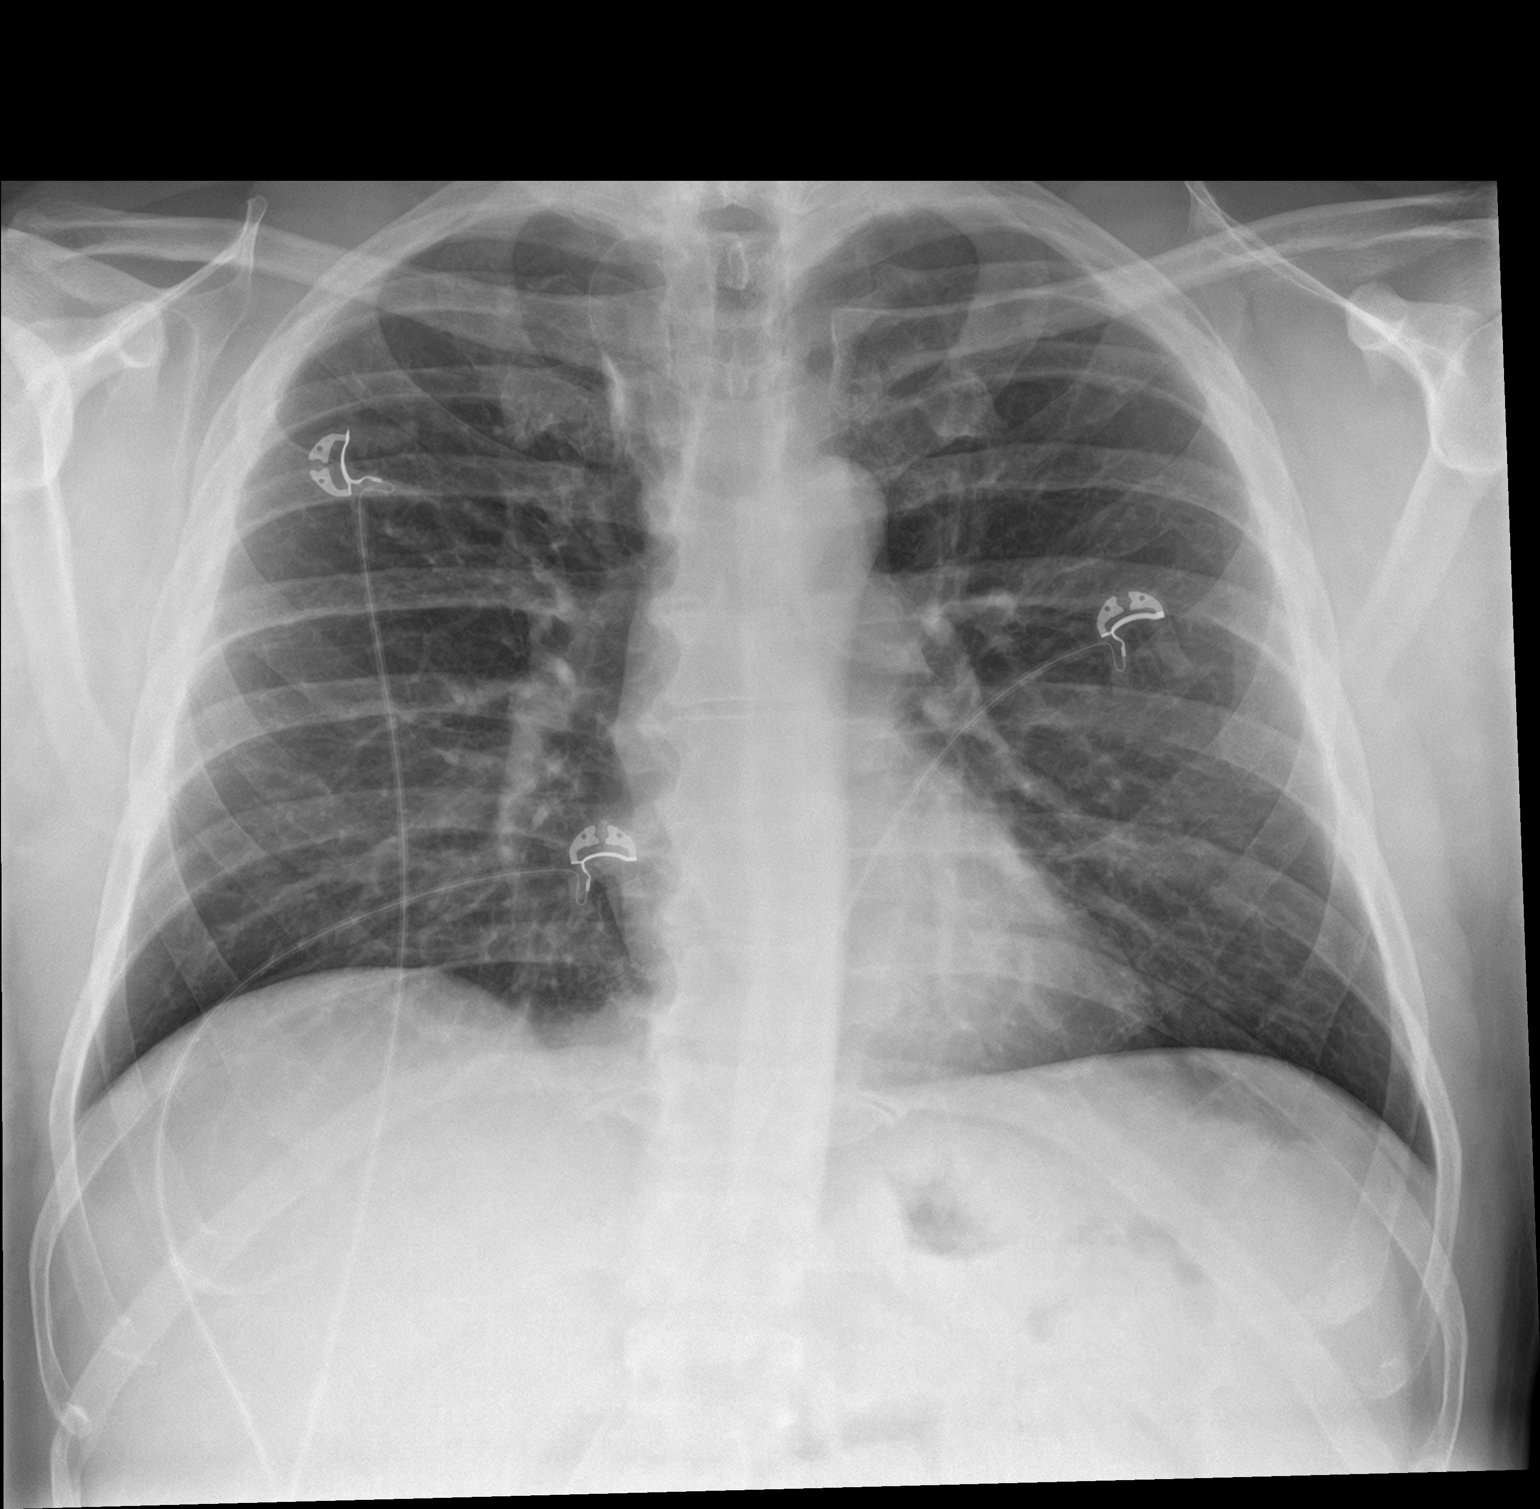

[chest lat]
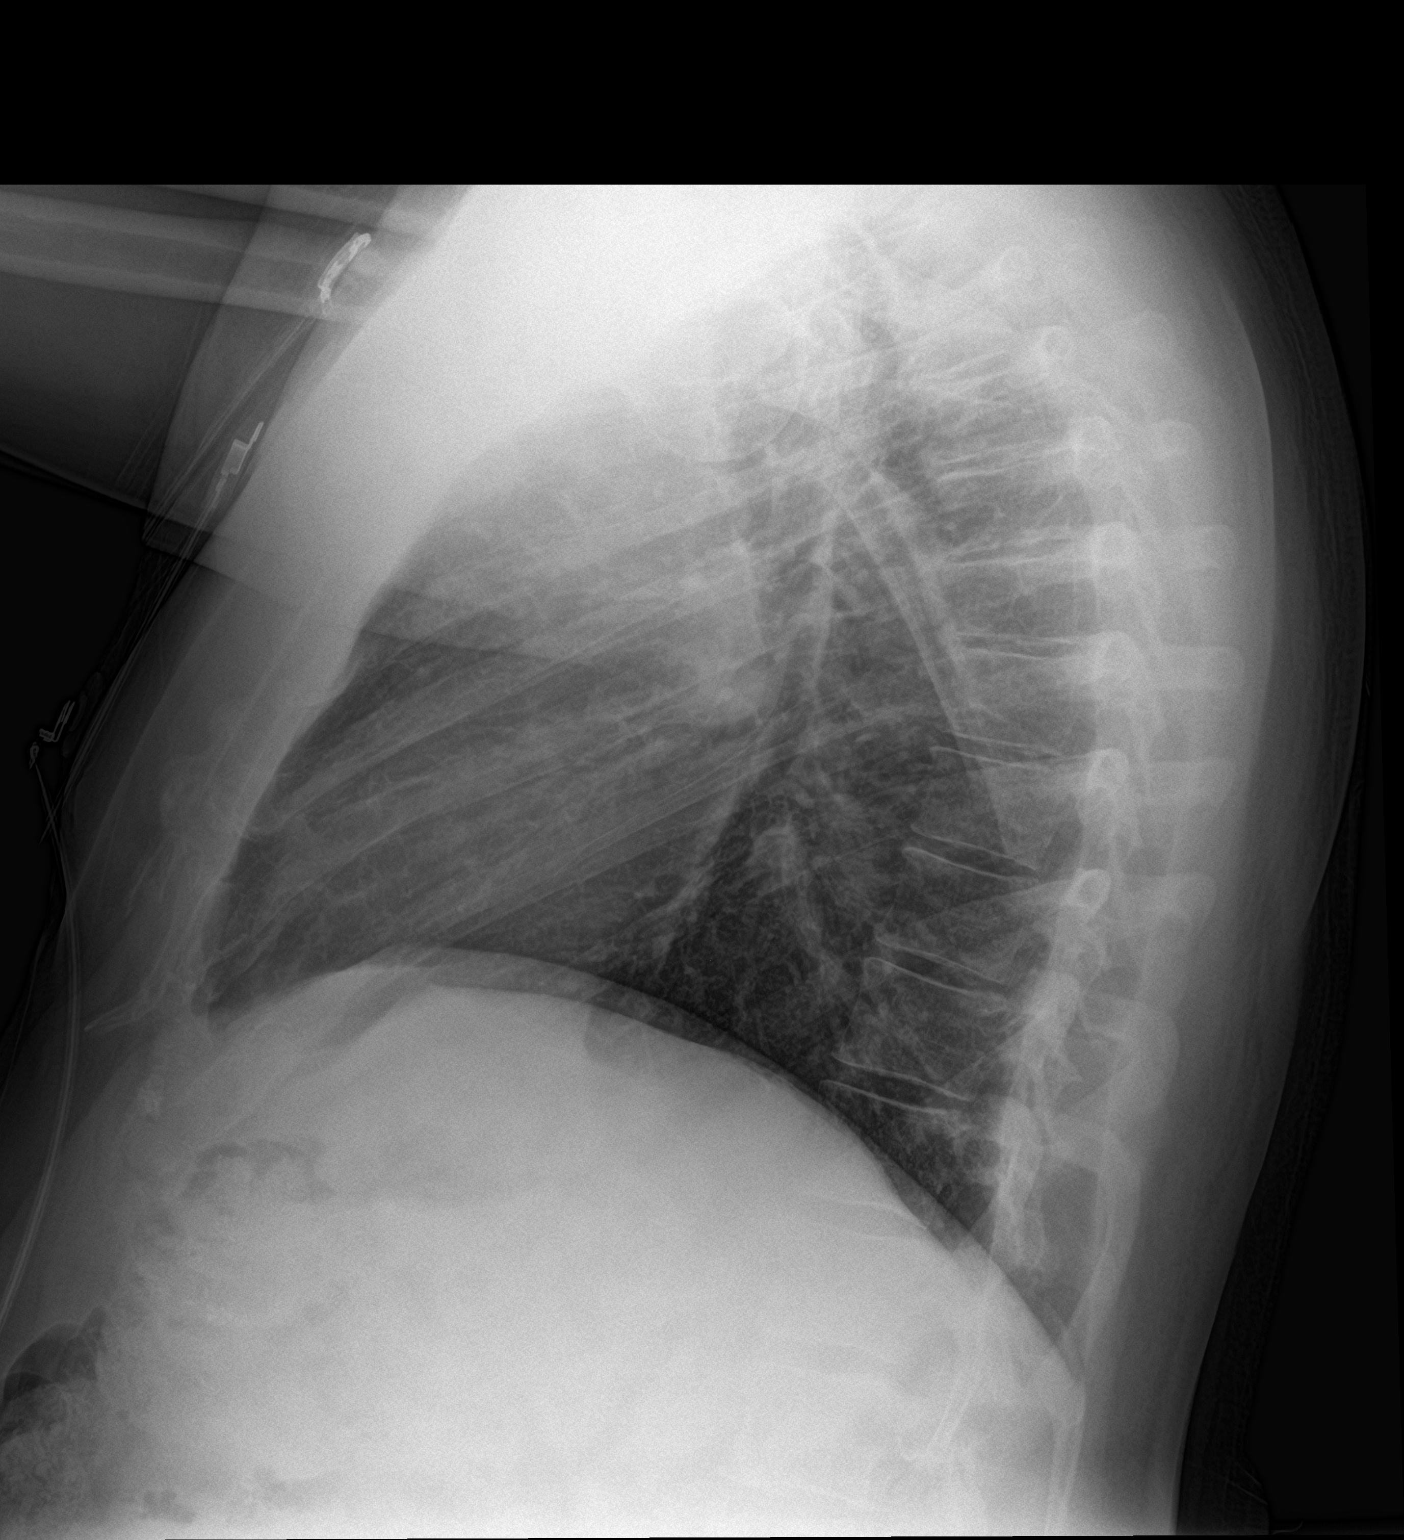

[2 of 2 positions shown; findings below may reference images not displayed]

FINDINGS: Lungs are clear. There is an azygos lobe on the right, an anatomic
variant. Heart size and pulmonary vascular normal. No adenopathy. No
bone lesions.
IMPRESSION: Lungs clear.  Cardiac silhouette within normal limits.

## 2020-09-24 ENCOUNTER — Other Ambulatory Visit: Payer: Self-pay | Admitting: Family Medicine

## 2020-10-24 ENCOUNTER — Other Ambulatory Visit: Payer: Self-pay | Admitting: Family Medicine

## 2020-10-24 NOTE — Telephone Encounter (Signed)
Patient is overdue for follow up. Please schedule with Dr Para March at earliest convenience. Thank you

## 2020-10-24 NOTE — Telephone Encounter (Signed)
Called pt to schedule appt. Pt did not answer so I left a VM.

## 2020-10-28 ENCOUNTER — Encounter: Payer: Self-pay | Admitting: Family Medicine

## 2020-10-28 NOTE — Telephone Encounter (Signed)
Left pt a message to call us to schedule a f/u appt.

## 2020-11-02 ENCOUNTER — Telehealth: Payer: Self-pay | Admitting: Family Medicine

## 2020-11-04 NOTE — Telephone Encounter (Signed)
Refill request for Januvia 100 mg tablets  LOV - 03/08/20 VV Next OV - 03/06/21 Last refill - 07/31/20 #90/0

## 2020-11-05 NOTE — Telephone Encounter (Signed)
Please call pt about DM2 f/u this fall.  Thanks.  Rx sent.

## 2020-11-07 NOTE — Telephone Encounter (Signed)
CALLED PATIENT AND GOT HIM IN ON 11/22 AT 330

## 2021-01-07 ENCOUNTER — Encounter: Payer: Self-pay | Admitting: Family Medicine

## 2021-01-07 ENCOUNTER — Other Ambulatory Visit: Payer: Self-pay

## 2021-01-07 ENCOUNTER — Ambulatory Visit: Payer: Federal, State, Local not specified - PPO | Admitting: Family Medicine

## 2021-01-07 VITALS — BP 138/70 | HR 107 | Temp 98.3°F | Ht 70.0 in | Wt 214.0 lb

## 2021-01-07 DIAGNOSIS — M720 Palmar fascial fibromatosis [Dupuytren]: Secondary | ICD-10-CM | POA: Diagnosis not present

## 2021-01-07 DIAGNOSIS — E119 Type 2 diabetes mellitus without complications: Secondary | ICD-10-CM | POA: Diagnosis not present

## 2021-01-07 DIAGNOSIS — Z23 Encounter for immunization: Secondary | ICD-10-CM

## 2021-01-07 MED ORDER — BASAGLAR KWIKPEN 100 UNIT/ML ~~LOC~~ SOPN: PEN_INJECTOR | SUBCUTANEOUS | Status: DC

## 2021-01-07 MED ORDER — SITAGLIPTIN PHOSPHATE 100 MG PO TABS: 100.0000 mg | ORAL_TABLET | Freq: Every day | ORAL | Status: DC

## 2021-01-07 NOTE — Patient Instructions (Addendum)
We'll call about seeing the hand clinic.  Go to the lab on the way out.   If you have mychart we'll likely use that to update you.    Take care.  Glad to see you.

## 2021-01-07 NOTE — Progress Notes (Signed)
This visit occurred during the SARS-CoV-2 public health emergency.  Safety protocols were in place, including screening questions prior to the visit, additional usage of staff PPE, and extensive cleaning of exam room while observing appropriate contact time as indicated for disinfecting solutions.  Diabetes:  Using medications without difficulties: yes Hypoglycemic episodes:no Hyperglycemic episodes:no Feet problems:no Blood Sugars averaging: 150-200 eye exam within last year: due, d/w pt.   Labs pending.  Flu shot today.   We talked about checking a fructosamine level to verify his A1c result.  His leg is improved in the meantime, he is back to playing pickleball.  Discussed.  R5th finger with palmar contraction.  Actually bilateral, but R>L 5th finger.  Normal flexion but limited extension.    Father had whipple surgery for cancer near the bile duct, now going through chemo tx.  D/w pt.  Support offered.    Meds, vitals, and allergies reviewed.  ROS: Per HPI unless specifically indicated in ROS section   GEN: nad, alert and oriented HEENT: ncat NECK: supple w/o LA CV: rrr. PULM: ctab, no inc wob ABD: soft, +bs EXT: no edema SKIN: ncat R>L 5th palmar contraction.    Diabetic foot exam: Normal inspection except for L>R plantar ligament thickening. Normal B 1st toe ROM.   No skin breakdown No calluses  Normal DP pulses Normal sensation to light touch and monofilament Nails normal  30 minutes were devoted to patient care in this encounter (this includes time spent reviewing the patient's file/history, interviewing and examining the patient, counseling/reviewing plan with patient).

## 2021-01-08 LAB — COMPREHENSIVE METABOLIC PANEL
ALT: 20 U/L (ref 0–53)
AST: 17 U/L (ref 0–37)
Albumin: 4.8 g/dL (ref 3.5–5.2)
Alkaline Phosphatase: 87 U/L (ref 39–117)
BUN: 17 mg/dL (ref 6–23)
CO2: 27 mEq/L (ref 19–32)
Calcium: 9.8 mg/dL (ref 8.4–10.5)
Chloride: 97 mEq/L (ref 96–112)
Creatinine, Ser: 1.13 mg/dL (ref 0.40–1.50)
GFR: 74.82 mL/min (ref >60.00)
Glucose, Bld: 200 mg/dL — ABNORMAL HIGH (ref 70–99)
Potassium: 4 mEq/L (ref 3.5–5.1)
Sodium: 134 mEq/L — ABNORMAL LOW (ref 135–145)
Total Bilirubin: 0.4 mg/dL (ref 0.2–1.2)
Total Protein: 7.7 g/dL (ref 6.0–8.3)

## 2021-01-08 LAB — HEMOGLOBIN A1C: Hgb A1c MFr Bld: 9.8 % — ABNORMAL HIGH (ref 4.6–6.5)

## 2021-01-08 LAB — LIPID PANEL
Cholesterol: 211 mg/dL — ABNORMAL HIGH (ref 0–200)
HDL: 39.9 mg/dL (ref >39.00)
NonHDL: 170.67
Total CHOL/HDL Ratio: 5
Triglycerides: 369 mg/dL — ABNORMAL HIGH (ref 0.0–149.0)
VLDL: 73.8 mg/dL — ABNORMAL HIGH (ref 0.0–40.0)

## 2021-01-08 LAB — CBC WITH DIFFERENTIAL/PLATELET
Basophils Absolute: 0.1 10*3/uL (ref 0.0–0.1)
Basophils Relative: 0.8 % (ref 0.0–3.0)
Eosinophils Absolute: 0.2 10*3/uL (ref 0.0–0.7)
Eosinophils Relative: 1.6 % (ref 0.0–5.0)
HCT: 41.7 % (ref 39.0–52.0)
Hemoglobin: 14.3 g/dL (ref 13.0–17.0)
Lymphocytes Relative: 25.7 % (ref 12.0–46.0)
Lymphs Abs: 3.1 10*3/uL (ref 0.7–4.0)
MCHC: 34.3 g/dL (ref 30.0–36.0)
MCV: 76.3 fl — ABNORMAL LOW (ref 78.0–100.0)
Monocytes Absolute: 0.9 10*3/uL (ref 0.1–1.0)
Monocytes Relative: 7.1 % (ref 3.0–12.0)
Neutro Abs: 7.9 10*3/uL — ABNORMAL HIGH (ref 1.4–7.7)
Neutrophils Relative %: 64.8 % (ref 43.0–77.0)
Platelets: 259 10*3/uL (ref 150.0–400.0)
RBC: 5.46 Mil/uL (ref 4.22–5.81)
RDW: 13.4 % (ref 11.5–15.5)
WBC: 12.1 10*3/uL — ABNORMAL HIGH (ref 4.0–10.5)

## 2021-01-08 LAB — LDL CHOLESTEROL, DIRECT: Direct LDL: 122 mg/dL

## 2021-01-12 LAB — FRUCTOSAMINE: Fructosamine: 390 umol/L — ABNORMAL HIGH (ref 205–285)

## 2021-01-12 NOTE — Assessment & Plan Note (Signed)
Refer to hand surgery.  Dupuytren contracture anatomy discussed with patient.

## 2021-01-12 NOTE — Assessment & Plan Note (Signed)
Continue insulin metformin and Januvia.  Continue work on diet and exercise.  See notes on labs.

## 2021-01-22 ENCOUNTER — Other Ambulatory Visit: Payer: Self-pay | Admitting: Family Medicine

## 2021-01-27 NOTE — Progress Notes (Signed)
LMTCB

## 2021-02-18 DIAGNOSIS — M79641 Pain in right hand: Secondary | ICD-10-CM | POA: Diagnosis not present

## 2021-02-18 DIAGNOSIS — M79642 Pain in left hand: Secondary | ICD-10-CM | POA: Diagnosis not present

## 2021-03-06 ENCOUNTER — Encounter: Payer: Self-pay | Admitting: Family Medicine

## 2021-03-06 ENCOUNTER — Ambulatory Visit (INDEPENDENT_AMBULATORY_CARE_PROVIDER_SITE_OTHER): Payer: Federal, State, Local not specified - PPO | Admitting: Family Medicine

## 2021-03-06 ENCOUNTER — Other Ambulatory Visit: Payer: Self-pay

## 2021-03-06 VITALS — BP 120/78 | HR 86 | Temp 98.1°F | Ht 70.0 in | Wt 214.0 lb

## 2021-03-06 DIAGNOSIS — Z7189 Other specified counseling: Secondary | ICD-10-CM

## 2021-03-06 DIAGNOSIS — I10 Essential (primary) hypertension: Secondary | ICD-10-CM

## 2021-03-06 DIAGNOSIS — M720 Palmar fascial fibromatosis [Dupuytren]: Secondary | ICD-10-CM

## 2021-03-06 DIAGNOSIS — E119 Type 2 diabetes mellitus without complications: Secondary | ICD-10-CM

## 2021-03-06 DIAGNOSIS — Z0000 Encounter for general adult medical examination without abnormal findings: Secondary | ICD-10-CM | POA: Diagnosis not present

## 2021-03-06 DIAGNOSIS — E785 Hyperlipidemia, unspecified: Secondary | ICD-10-CM

## 2021-03-06 MED ORDER — SITAGLIPTIN PHOSPHATE 100 MG PO TABS: 100.0000 mg | ORAL_TABLET | Freq: Every day | ORAL | Status: DC

## 2021-03-06 MED ORDER — OZEMPIC (0.25 OR 0.5 MG/DOSE) 2 MG/1.5ML ~~LOC~~ SOPN: PEN_INJECTOR | SUBCUTANEOUS | 3 refills | Status: DC

## 2021-03-06 MED ORDER — BASAGLAR KWIKPEN 100 UNIT/ML ~~LOC~~ SOPN: PEN_INJECTOR | SUBCUTANEOUS | Status: DC

## 2021-03-06 NOTE — Patient Instructions (Addendum)
If you can get ozempic filled, then start with 0.25 mg once weekly for 4 weeks, then increase to 0.5 mg once weekly.    If you can't get it filled, then let me know.    If you start ozempic, then stop [place].    Plan on recheck in about 3 months.  Labs ahead of time if possible.    Take care.  Glad to see you.

## 2021-03-06 NOTE — Progress Notes (Signed)
This visit occurred during the SARS-CoV-2 public health emergency.  Safety protocols were in place, including screening questions prior to the visit, additional usage of staff PPE, and extensive cleaning of exam room while observing appropriate contact time as indicated for disinfecting solutions.  CPE- See plan.  Routine anticipatory guidance given to patient.  See health maintenance.  The possibility exists that previously documented standard health maintenance information may have been brought forward from a previous encounter into this note.  If needed, that same information has been updated to reflect the current situation based on today's encounter.    Tetanus 2018 Flu 2022  PNA 2015 Shingles d/w pt.   Covid vaccine d/w pt.   Prostate cancer screening and PSA options (with potential risks and benefits of testing vs not testing) were discussed along with recent recs/guidelines.  He declined testing PSA at this point. D/w patient [id number] for colon cancer screening, including IFOB vs. colonoscopy.  Risks and benefits of both were discussed and patient voiced understanding.  Pt elects to consider for now, he will update me when he makes a decision. Living will d/w pt.   Wife designated if patient were incapacitated.   Diet and exercise d/w pt.   His father is done with chemo, d/w pt.    He saw hand clinic in the meantime, on observation at this point for contracture.    Hypertension:    Using medication without problems or lightheadedness: yes Chest pain with exertion:no Edema:no Short of breath:no Prev labs d/w pt.  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    Diabetes:  Using medications without difficulties: yes Hypoglycemic episodes: no Hyperglycemic episodes: no Feet problems: no Blood Sugars averaging: 150-200, improved with higher insulin dose.   eye exam within last year: to be done this year He has increased his insulin to 75 units.   Elevated  Cholesterol: Using medications without problems: yes Muscle aches: no Diet compliance: d/w pt.  . Exercise: d/w pt.    PMH and SH reviewed  Meds, vitals, and allergies reviewed.   ROS: Per HPI.  Unless specifically indicated otherwise in HPI, the patient denies:  General: fever. Eyes: acute vision changes ENT: sore throat Cardiovascular: chest pain Respiratory: SOB GI: vomiting GU: dysuria Musculoskeletal: acute back pain Derm: acute rash Neuro: acute motor dysfunction Psych: worsening mood Endocrine: polydipsia Heme: bleeding Allergy: hayfever  GEN: nad, alert and oriented HEENT: ncat NECK: supple w/o LA CV: rrr. PULM: ctab, no inc wob ABD: soft, +bs EXT: no edema SKIN: well perfused.   Dupuytren contracture noted in the hands, mild.

## 2021-03-09 NOTE — Assessment & Plan Note (Signed)
He saw hand clinic in the meantime, on observation at this point for contracture.   .  I will defer.  He agrees

## 2021-03-09 NOTE — Assessment & Plan Note (Signed)
See notes on labs.  He may have discrepancy between A1c and fructosamine.  Continue insulin.  Stop Januvia.  Start Ozempic.  If he cannot get Ozempic filled then he will let me know.  Continue metformin.  Plan on recheck in 3 months.  Routine Ozempic cautions discussed with patient.  He is aware of possible pancreatitis, etc.

## 2021-03-09 NOTE — Assessment & Plan Note (Signed)
Living will d/w pt.  Wife designated if patient were incapacitated.   ?

## 2021-03-09 NOTE — Assessment & Plan Note (Signed)
Continue work on diet and exercise.  Continue lisinopril.  See notes on labs. ?

## 2021-03-09 NOTE — Assessment & Plan Note (Signed)
Tetanus 2018 Flu 2022  PNA 2015 Shingles d/w pt.   Covid vaccine d/w pt.   Prostate cancer screening and PSA options (with potential risks and benefits of testing vs not testing) were discussed along with recent recs/guidelines.  He declined testing PSA at this point. D/w patient [id number] for colon cancer screening, including IFOB vs. colonoscopy.  Risks and benefits of both were discussed and patient voiced understanding.  Pt elects to consider for now, he will update me when he makes a decision. Living will d/w pt.   Wife designated if patient were incapacitated.   Diet and exercise d/w pt.

## 2021-03-09 NOTE — Assessment & Plan Note (Signed)
Continue work on diet and exercise.  Continue simvastatin. 

## 2021-03-23 ENCOUNTER — Other Ambulatory Visit: Payer: Self-pay | Admitting: Family Medicine

## 2021-03-23 DIAGNOSIS — E119 Type 2 diabetes mellitus without complications: Secondary | ICD-10-CM

## 2021-05-19 ENCOUNTER — Telehealth: Payer: Self-pay | Admitting: *Deleted

## 2021-05-19 NOTE — Telephone Encounter (Signed)
Pt called because he tested positive and wanted to know the quarantine time frame. I did advise him 5 days from start of sxs unless he is still sick on day 5 then quarantine until sxs have improved/resolved. Pt verbalized understanding. Pt said that he is stable no sob or chest pain and he is treating sxs with OTC meds and doesn't think he needs an appt. ER precautions given. Since we are out of appts today I also advise him he can do a virtual appt through his mychart if his sxs worsen or has any concern's pt verbalized understanding, FYI to PCP ?

## 2021-05-20 ENCOUNTER — Encounter: Payer: Self-pay | Admitting: Family Medicine

## 2021-05-20 NOTE — Telephone Encounter (Signed)
Noted. Thanks. Will defer to patient.  

## 2021-06-19 ENCOUNTER — Telehealth: Payer: Self-pay | Admitting: Family Medicine

## 2021-06-19 MED ORDER — OZEMPIC (0.25 OR 0.5 MG/DOSE) 2 MG/1.5ML ~~LOC~~ SOPN: 0.5000 mg | PEN_INJECTOR | SUBCUTANEOUS | 2 refills | Status: DC

## 2021-06-19 NOTE — Telephone Encounter (Signed)
Spoke with patient and he stated that the dosing on his medication needs to be changed to state he is taking 0.5 mg now and no the starting dose it says on his rx.  ?

## 2021-06-19 NOTE — Telephone Encounter (Signed)
Pt called and stated he thinks his medication (mg) needs to be changed but would like to speak to the nurse about this. Medication: Semaglutide,0.25 or 0.5MG /DOS, (OZEMPIC, 0.25 OR 0.5 MG/DOSE,) 2 MG/1.5ML SOPN ? ?Callback Number: [phone] ?

## 2021-06-19 NOTE — Addendum Note (Signed)
Addended by: Joaquim Nam on: 06/19/2021 04:48 PM ? ? Modules accepted: Orders ? ?

## 2021-06-19 NOTE — Telephone Encounter (Signed)
Sent. Thanks.   

## 2021-07-27 ENCOUNTER — Other Ambulatory Visit: Payer: Self-pay | Admitting: Family Medicine

## 2021-10-23 ENCOUNTER — Other Ambulatory Visit: Payer: Self-pay | Admitting: Family Medicine

## 2021-11-23 ENCOUNTER — Other Ambulatory Visit: Payer: Self-pay | Admitting: Family Medicine

## 2021-11-24 NOTE — Telephone Encounter (Signed)
Patient will be due for CPE in Jan 2024; please call to schedule.

## 2021-11-24 NOTE — Telephone Encounter (Signed)
Patient has been scheduled

## 2022-01-21 ENCOUNTER — Other Ambulatory Visit: Payer: Self-pay | Admitting: Family Medicine

## 2022-02-15 ENCOUNTER — Other Ambulatory Visit: Payer: Self-pay | Admitting: Family Medicine

## 2022-02-15 DIAGNOSIS — E119 Type 2 diabetes mellitus without complications: Secondary | ICD-10-CM

## 2022-02-16 ENCOUNTER — Other Ambulatory Visit: Payer: Self-pay | Admitting: Family Medicine

## 2022-02-26 ENCOUNTER — Other Ambulatory Visit: Payer: Federal, State, Local not specified - PPO

## 2022-03-16 ENCOUNTER — Ambulatory Visit (INDEPENDENT_AMBULATORY_CARE_PROVIDER_SITE_OTHER): Payer: Federal, State, Local not specified - PPO | Admitting: Family Medicine

## 2022-03-16 ENCOUNTER — Encounter: Payer: Self-pay | Admitting: Family Medicine

## 2022-03-16 ENCOUNTER — Ambulatory Visit (INDEPENDENT_AMBULATORY_CARE_PROVIDER_SITE_OTHER)
Admission: RE | Admit: 2022-03-16 | Discharge: 2022-03-16 | Disposition: A | Discharge status: Home / Self Care | Payer: Federal, State, Local not specified - PPO | Source: Ambulatory Visit | Attending: Family Medicine | Admitting: Family Medicine

## 2022-03-16 VITALS — BP 118/66 | HR 73 | Temp 97.3°F | Ht 70.0 in | Wt 210.0 lb

## 2022-03-16 DIAGNOSIS — M25511 Pain in right shoulder: Secondary | ICD-10-CM | POA: Diagnosis not present

## 2022-03-16 DIAGNOSIS — E119 Type 2 diabetes mellitus without complications: Secondary | ICD-10-CM

## 2022-03-16 DIAGNOSIS — E785 Hyperlipidemia, unspecified: Secondary | ICD-10-CM

## 2022-03-16 DIAGNOSIS — Z0000 Encounter for general adult medical examination without abnormal findings: Secondary | ICD-10-CM | POA: Diagnosis not present

## 2022-03-16 DIAGNOSIS — I10 Essential (primary) hypertension: Secondary | ICD-10-CM

## 2022-03-16 DIAGNOSIS — Z7189 Other specified counseling: Secondary | ICD-10-CM

## 2022-03-16 LAB — LIPID PANEL
Cholesterol: 165 mg/dL (ref 0–200)
HDL: 37.2 mg/dL — ABNORMAL LOW (ref >39.00)
NonHDL: 128.27
Total CHOL/HDL Ratio: 4
Triglycerides: 265 mg/dL — ABNORMAL HIGH (ref 0.0–149.0)
VLDL: 53 mg/dL — ABNORMAL HIGH (ref 0.0–40.0)

## 2022-03-16 LAB — COMPREHENSIVE METABOLIC PANEL
ALT: 19 U/L (ref 0–53)
AST: 16 U/L (ref 0–37)
Albumin: 4.6 g/dL (ref 3.5–5.2)
Alkaline Phosphatase: 75 U/L (ref 39–117)
BUN: 12 mg/dL (ref 6–23)
CO2: 28 mEq/L (ref 19–32)
Calcium: 9.4 mg/dL (ref 8.4–10.5)
Chloride: 101 mEq/L (ref 96–112)
Creatinine, Ser: 1.05 mg/dL (ref 0.40–1.50)
GFR: 81.03 mL/min (ref >60.00)
Glucose, Bld: 122 mg/dL — ABNORMAL HIGH (ref 70–99)
Potassium: 4.2 mEq/L (ref 3.5–5.1)
Sodium: 140 mEq/L (ref 135–145)
Total Bilirubin: 0.4 mg/dL (ref 0.2–1.2)
Total Protein: 7.1 g/dL (ref 6.0–8.3)

## 2022-03-16 LAB — CBC WITH DIFFERENTIAL/PLATELET
Basophils Absolute: 0.1 10*3/uL (ref 0.0–0.1)
Basophils Relative: 0.6 % (ref 0.0–3.0)
Eosinophils Absolute: 0.2 10*3/uL (ref 0.0–0.7)
Eosinophils Relative: 2.5 % (ref 0.0–5.0)
HCT: 42.7 % (ref 39.0–52.0)
Hemoglobin: 15.1 g/dL (ref 13.0–17.0)
Lymphocytes Relative: 29.8 % (ref 12.0–46.0)
Lymphs Abs: 2.7 10*3/uL (ref 0.7–4.0)
MCHC: 35.3 g/dL (ref 30.0–36.0)
MCV: 76.4 fl — ABNORMAL LOW (ref 78.0–100.0)
Monocytes Absolute: 0.5 10*3/uL (ref 0.1–1.0)
Monocytes Relative: 5.8 % (ref 3.0–12.0)
Neutro Abs: 5.5 10*3/uL (ref 1.4–7.7)
Neutrophils Relative %: 61.3 % (ref 43.0–77.0)
Platelets: 246 10*3/uL (ref 150.0–400.0)
RBC: 5.59 Mil/uL (ref 4.22–5.81)
RDW: 14.1 % (ref 11.5–15.5)
WBC: 8.9 10*3/uL (ref 4.0–10.5)

## 2022-03-16 LAB — HEMOGLOBIN A1C: Hgb A1c MFr Bld: 6.8 % — ABNORMAL HIGH (ref 4.6–6.5)

## 2022-03-16 LAB — MICROALBUMIN / CREATININE URINE RATIO
Creatinine,U: 427.6 mg/dL
Microalb Creat Ratio: 6.1 mg/g (ref 0.0–30.0)
Microalb, Ur: 26.1 mg/dL — ABNORMAL HIGH (ref 0.0–1.9)

## 2022-03-16 LAB — TSH: TSH: 1.36 u[IU]/mL (ref 0.35–5.50)

## 2022-03-16 LAB — LDL CHOLESTEROL, DIRECT: Direct LDL: 82 mg/dL

## 2022-03-16 MED ORDER — OZEMPIC (0.25 OR 0.5 MG/DOSE) 2 MG/3ML ~~LOC~~ SOPN: PEN_INJECTOR | SUBCUTANEOUS | 1 refills | Status: DC

## 2022-03-16 NOTE — Patient Instructions (Addendum)
Let me know what you want to do for colon cancer screening.  Take care.  Glad to see you. Please check on an eye exam.    Go to the lab on the way out.   If you have mychart we'll likely use that to update you.

## 2022-03-16 NOTE — Progress Notes (Unsigned)
CPE- See plan.  Routine anticipatory guidance given to patient.  See health maintenance.  The possibility exists that previously documented standard health maintenance information may have been brought forward from a previous encounter into this note.  If needed, that same information has been updated to reflect the current situation based on today's encounter.    Tetanus 2018 Flu d/w pt. Out of flu vaccine in clinic as of 2024. PNA 2015 Shingles d/w pt.   Covid vaccine d/w pt.   Prostate cancer screening and PSA options (with potential risks and benefits of testing vs not testing) were discussed along with recent recs/guidelines.  He declined testing PSA at this point. D/w patient [id number] for colon cancer screening, including IFOB vs. colonoscopy.  Risks and benefits of both were discussed and patient voiced understanding.  Pt elects to consider for now, he will update me when he makes a decision. Living will d/w pt.   Wife designated if patient were incapacitated.   Diet and exercise d/w pt.  Diabetes:  Using medications without difficulties: yes Hypoglycemic episodes:no Hyperglycemic episodes:no Feet problems:no Blood Sugars averaging: prev ~110 eye exam within last year: due, d/w pt.   Labs pending  Hypertension:    Using medication without problems or lightheadedness: yes Chest pain with exertion:no Edema:no Short of breath:no  Elevated Cholesterol: Using medications without problems: yes Muscle aches: no Diet compliance: d/w pt.   Exercise: yes  R shoulder pain.  Has been playing pickleball.  Disc golf.  Pain overhead some of the time.  Pain sleeping on R side.  Episodic discomfort/soreness.  R handed.   PMH and SH reviewed  Meds, vitals, and allergies reviewed.   ROS: Per HPI.  Unless specifically indicated otherwise in HPI, the patient denies:  General: fever. Eyes: acute vision changes ENT: sore throat Cardiovascular: chest pain Respiratory: SOB GI:  vomiting GU: dysuria Musculoskeletal: acute back pain Derm: acute rash Neuro: acute motor dysfunction Psych: worsening mood Endocrine: polydipsia Heme: bleeding Allergy: hayfever  GEN: nad, alert and oriented HEENT: mucous membranes moist NECK: supple w/o LA CV: rrr. PULM: ctab, no inc wob ABD: soft, +bs EXT: no edema SKIN: no acute rash Right shoulder with overhead movement intact with pain on external rotation.  Less pain on internal rotation.  No arm drop.  Distally neurovascular intact with normal grip.  Diabetic foot exam: Normal inspection No skin breakdown No calluses  Normal DP pulses Normal sensation to light touch and monofilament Nails normal

## 2022-03-18 DIAGNOSIS — M25511 Pain in right shoulder: Secondary | ICD-10-CM | POA: Insufficient documentation

## 2022-03-18 LAB — FRUCTOSAMINE: Fructosamine: 259 umol/L (ref 205–285)

## 2022-03-18 NOTE — Assessment & Plan Note (Signed)
Discussed with patient about potential rotator cuff pathology.  See notes on imaging.

## 2022-03-18 NOTE — Assessment & Plan Note (Signed)
Continue simvastatin.  See notes on labs.

## 2022-03-18 NOTE — Assessment & Plan Note (Signed)
Continue insulin metformin and Ozempic as is.  See notes on labs.

## 2022-03-18 NOTE — Assessment & Plan Note (Signed)
Living will d/w pt.  Wife designated if patient were incapacitated.   ?

## 2022-03-18 NOTE — Assessment & Plan Note (Signed)
Tetanus 2018 Flu d/w pt. Out of flu vaccine in clinic as of 2024. PNA 2015 Shingles d/w pt.   Covid vaccine d/w pt.   Prostate cancer screening and PSA options (with potential risks and benefits of testing vs not testing) were discussed along with recent recs/guidelines.  He declined testing PSA at this point. D/w patient [id number] for colon cancer screening, including IFOB vs. colonoscopy.  Risks and benefits of both were discussed and patient voiced understanding.  Pt elects to consider for now, he will update me when he makes a decision. Living will d/w pt.   Wife designated if patient were incapacitated.   Diet and exercise d/w pt.

## 2022-03-18 NOTE — Assessment & Plan Note (Signed)
Continue lisinopril.  See notes on labs. 

## 2022-04-21 ENCOUNTER — Other Ambulatory Visit: Payer: Self-pay | Admitting: Family Medicine

## 2022-05-24 ENCOUNTER — Other Ambulatory Visit: Payer: Self-pay | Admitting: Family Medicine

## 2022-05-24 DIAGNOSIS — E119 Type 2 diabetes mellitus without complications: Secondary | ICD-10-CM

## 2022-06-01 ENCOUNTER — Encounter: Payer: Self-pay | Admitting: Internal Medicine

## 2022-06-01 ENCOUNTER — Ambulatory Visit: Payer: Federal, State, Local not specified - PPO | Admitting: Internal Medicine

## 2022-06-01 VITALS — BP 120/82 | HR 80 | Temp 97.6°F | Ht 70.0 in | Wt 209.0 lb

## 2022-06-01 DIAGNOSIS — H1033 Unspecified acute conjunctivitis, bilateral: Secondary | ICD-10-CM | POA: Diagnosis not present

## 2022-06-01 MED ORDER — POLYMYXIN B-TRIMETHOPRIM 10000-0.1 UNIT/ML-% OP SOLN: 2.0000 [drp] | Freq: Four times a day (QID) | OPHTHALMIC | 0 refills | Status: DC

## 2022-06-01 NOTE — Progress Notes (Signed)
Subjective:    Patient ID: Harry Whitaker, male    DOB: 01-Jun-1970, 52 y.o.   MRN: [id number]  HPI Here due to eye problems  Started in right eye--then went to left eye Started 3-4 days ago---noticed after work Thought it might be allergies--but he doesn't usually have them  Throat itchy---but doesn't really feel like a cold Slightly achy No fever No ear itching or pain No cough---but throat feels closed up some  Wife was sick a week ago---was in her eyes also  Tried mucinex and allergy pill---not overly helpful Tylenol has helped throat  Eyes were gunky this morning  Current Outpatient Medications on File Prior to Visit  Medication Sig Dispense Refill   Insulin Glargine (BASAGLAR KWIKPEN) 100 UNIT/ML ADMINISTER UP TO 80 UNITS UNDER THE SKIN DAILY 75 mL 2   Insulin Pen Needle (B-D UF III MINI PEN NEEDLES) 31G X 5 MM MISC USE AS DIRECTED EVERY DAY 100 each 3   lisinopril (ZESTRIL) 20 MG tablet TAKE 1 TABLET(20 MG) BY MOUTH DAILY 90 tablet 3   metFORMIN (GLUCOPHAGE) 1000 MG tablet TAKE 1 TABLET(1000 MG) BY MOUTH DAILY 90 tablet 3   omeprazole (PRILOSEC) 20 MG capsule Take 1 capsule (20 mg total) by mouth daily. 90 capsule 3   Semaglutide,0.25 or 0.5MG /DOS, (OZEMPIC, 0.25 OR 0.5 MG/DOSE,) 2 MG/3ML SOPN INJECT 0.5 MG UNDER THE SKIN ONCE WEEKLY 9 mL 1   simvastatin (ZOCOR) 40 MG tablet TAKE 1 TABLET(40 MG) BY MOUTH AT BEDTIME 90 tablet 3   No current facility-administered medications on file prior to visit.    Allergies  Allergen Reactions   Metformin And Related     Intolerant of >1000mg  a day due to GI upset.     Past Medical History:  Diagnosis Date   Diabetes mellitus    GERD (gastroesophageal reflux disease)    Hyperlipidemia    Hypertension     Past Surgical History:  Procedure Laterality Date   BACK SURGERY  2009   Herniated Disk    Family History  Problem Relation Age of Onset   Diabetes Mother    Hyperlipidemia Mother    Hypertension Mother    Diabetes  Father    Cancer - Other Father        bile duct   Heart disease Brother    Colon cancer Paternal Grandfather    Prostate cancer Neg Hx     Social History   Socioeconomic History   Marital status: Married    Spouse name: Not on file   Number of children: 1   Years of education: Not on file   Highest education level: Not on file  Occupational History   Occupation: Patent examiner: [employer]    Comment: Since 1992  Tobacco Use   Smoking status: Never   Smokeless tobacco: Never  Vaping Use   Vaping Use: Never used  Substance and Sexual Activity   Alcohol use: Yes    Alcohol/week: 0.0 standard drinks of alcohol    Comment: Rare   Drug use: No   Sexual activity: Not on file  Other Topics Concern   Not on file  Social History Narrative   Regular exercise:  Yes, walks some for postal route.   Education:  [employer], [employer]   Married 1996   1 daughter graduated from [employer], Doctor, general practice.     Social Determinants of Health   Financial Resource Strain: Not on file  Food Insecurity:  Not on file  Transportation Needs: Not on file  Physical Activity: Not on file  Stress: Not on file  Social Connections: Not on file  Intimate Partner Violence: Not on file   Review of Systems No N/V Eating okay     Objective:   Physical Exam Constitutional:      Appearance: Normal appearance.  HENT:     Right Ear: Tympanic membrane and ear canal normal.     Left Ear: Tympanic membrane and ear canal normal.     Nose:     Comments: Mild pale congestion    Mouth/Throat:     Pharynx: No oropharyngeal exudate or posterior oropharyngeal erythema.  Eyes:     Comments: Moderate conjunctival injection bilaterally  Neck:     Comments: ?non tender anterior cervical nodes Musculoskeletal:     Cervical back: Neck supple.  Neurological:     Mental Status: He is alert.            Assessment & Plan:

## 2022-06-01 NOTE — Assessment & Plan Note (Signed)
Likely bacterial Don't think it is allergic---no history in the past Will treat with polytrim---no contacts

## 2022-08-31 LAB — HM DIABETES EYE EXAM

## 2022-09-23 ENCOUNTER — Other Ambulatory Visit: Payer: Self-pay | Admitting: Family Medicine

## 2022-09-24 ENCOUNTER — Encounter: Payer: Self-pay | Admitting: Family Medicine

## 2022-09-24 ENCOUNTER — Ambulatory Visit: Payer: Federal, State, Local not specified - PPO | Admitting: Family Medicine

## 2022-09-24 VITALS — BP 122/62 | HR 82 | Temp 98.1°F | Ht 70.0 in | Wt 210.0 lb

## 2022-09-24 DIAGNOSIS — E119 Type 2 diabetes mellitus without complications: Secondary | ICD-10-CM | POA: Diagnosis not present

## 2022-09-24 DIAGNOSIS — Z794 Long term (current) use of insulin: Secondary | ICD-10-CM

## 2022-09-24 DIAGNOSIS — M25529 Pain in unspecified elbow: Secondary | ICD-10-CM

## 2022-09-24 DIAGNOSIS — Z7984 Long term (current) use of oral hypoglycemic drugs: Secondary | ICD-10-CM

## 2022-09-24 LAB — POCT GLYCOSYLATED HEMOGLOBIN (HGB A1C): Hemoglobin A1C: 6.5 % — AB (ref 4.0–5.6)

## 2022-09-24 MED ORDER — OZEMPIC (0.25 OR 0.5 MG/DOSE) 2 MG/3ML ~~LOC~~ SOPN: PEN_INJECTOR | SUBCUTANEOUS | 1 refills | Status: DC

## 2022-09-24 NOTE — Patient Instructions (Addendum)
Use the elbow strap and ice as needed.  Take care.  Glad to see you. Don't change your meds for now and recheck at a yearly visit in early 2025. Labs ahead of time.  Thanks for your effort.

## 2022-09-24 NOTE — Progress Notes (Signed)
Diabetes:  Using medications without difficulties: yes Hypoglycemic episodes:no Hyperglycemic episodes: no Feet problems: no Blood Sugars averaging: prev improved.   eye exam within last year:yes- [facility].   A1c 6.5.  playing pickleball and working.   Metformin 1000mg  a day.  80 units insulin once a day.   0.5mg  ozempic weekly.   Dec in appetite on med.   He is working on diet.   L arm sore after a missed golf drive- better in the meantime.  L medial forearm with resolving bruising.  Lateral elbow pain with grip but this is improving.    Meds, vitals, and allergies reviewed.  ROS: Per HPI unless specifically indicated in ROS section   GEN: nad, alert and oriented HEENT: ncat NECK: supple w/o LA CV: rrr. PULM: ctab, no inc wob ABD: soft, +bs EXT: no edema SKIN: well perfused.  L medial forearm with resolving bruising.  Lateral elbow pain with grip. 2 small <1cm scabs, 1 each on the B lateral knees, R knee 2mm, L knee 5mm.  Benign appearing.

## 2022-09-27 DIAGNOSIS — M25529 Pain in unspecified elbow: Secondary | ICD-10-CM | POA: Insufficient documentation

## 2022-09-27 NOTE — Assessment & Plan Note (Signed)
A1c 6.5.  playing pickleball and working.   Metformin 1000mg  a day.  80 units insulin once a day.   0.5mg  ozempic weekly.   Dec in appetite on med.   He is working on diet.  No change in medication.  Recheck periodically.

## 2022-09-27 NOTE — Assessment & Plan Note (Signed)
Likely golfers elbow.  Discussed icing as needed and using a strap and then gradually returning to golf as tolerated.  Update me as needed.

## 2022-10-01 ENCOUNTER — Telehealth: Payer: Self-pay | Admitting: Family Medicine

## 2022-10-01 NOTE — Telephone Encounter (Signed)
Patient called in and was wanting to know if Dr. Para March would take his wife on as a new patient. He stated that she needs a new primary care provider. Please advise. Thank you!

## 2022-10-02 NOTE — Telephone Encounter (Signed)
Spoke to pt, told Harry Whitaker's response on accepting his wife as pt. Pt states his wife, Harry Whitaker, had a cyst removal surgery on her scalp on yesterday, 8/15 & there were some concerns about her high blood sugar levels. Pt states Harry Whitaker's sugar was leveling at 289 on yesterday & he wanted to possibly get her seen sooner than the fall. Is there a possibility Harry Whitaker can be seen sooner? Call back # [phone] Harry Whitaker's [phone].

## 2022-10-02 NOTE — Telephone Encounter (Signed)
Yes, please schedule this fall when possible.  Thanks.

## 2022-10-02 NOTE — Telephone Encounter (Signed)
Dr. Para March has openings Thursday or Friday if patient can come then

## 2022-10-02 NOTE — Telephone Encounter (Signed)
Can she get set up here next week? Would need OV.

## 2022-10-02 NOTE — Telephone Encounter (Signed)
Scheduled pt wife for 8/23

## 2022-11-22 ENCOUNTER — Other Ambulatory Visit: Payer: Self-pay | Admitting: Family Medicine

## 2022-12-23 ENCOUNTER — Telehealth: Payer: Self-pay

## 2022-12-23 NOTE — Telephone Encounter (Signed)
Pharmacy Patient Advocate Encounter   Received notification from CoverMyMeds that prior authorization for Ozempic (0.25 or 0.5 MG/DOSE) 2MG /3ML pen-injectors is required/requested.   Insurance verification completed.   The patient is insured through [employer] .   Per test claim: APPROVED from 11/23/22 to 12/23/23   Key: [id number] PA#: [id number]   [employer] FEP Renewals

## 2023-03-07 ENCOUNTER — Other Ambulatory Visit: Payer: Self-pay | Admitting: Family Medicine

## 2023-03-07 DIAGNOSIS — E119 Type 2 diabetes mellitus without complications: Secondary | ICD-10-CM

## 2023-03-11 ENCOUNTER — Other Ambulatory Visit: Payer: Federal, State, Local not specified - PPO

## 2023-03-11 DIAGNOSIS — E119 Type 2 diabetes mellitus without complications: Secondary | ICD-10-CM | POA: Diagnosis not present

## 2023-03-11 LAB — COMPREHENSIVE METABOLIC PANEL
ALT: 16 U/L (ref 0–53)
AST: 16 U/L (ref 0–37)
Albumin: 4.7 g/dL (ref 3.5–5.2)
Alkaline Phosphatase: 75 U/L (ref 39–117)
BUN: 14 mg/dL (ref 6–23)
CO2: 29 meq/L (ref 19–32)
Calcium: 9.9 mg/dL (ref 8.4–10.5)
Chloride: 101 meq/L (ref 96–112)
Creatinine, Ser: 1.08 mg/dL (ref 0.40–1.50)
GFR: 77.8 mL/min (ref >60.00)
Glucose, Bld: 147 mg/dL — ABNORMAL HIGH (ref 70–99)
Potassium: 4.3 meq/L (ref 3.5–5.1)
Sodium: 139 meq/L (ref 135–145)
Total Bilirubin: 0.4 mg/dL (ref 0.2–1.2)
Total Protein: 7.3 g/dL (ref 6.0–8.3)

## 2023-03-11 LAB — CBC WITH DIFFERENTIAL/PLATELET
Basophils Absolute: 0 10*3/uL (ref 0.0–0.1)
Basophils Relative: 0.4 % (ref 0.0–3.0)
Eosinophils Absolute: 0.3 10*3/uL (ref 0.0–0.7)
Eosinophils Relative: 3.4 % (ref 0.0–5.0)
HCT: 42.5 % (ref 39.0–52.0)
Hemoglobin: 14.4 g/dL (ref 13.0–17.0)
Lymphocytes Relative: 28.8 % (ref 12.0–46.0)
Lymphs Abs: 2.5 10*3/uL (ref 0.7–4.0)
MCHC: 33.9 g/dL (ref 30.0–36.0)
MCV: 78.3 fL (ref 78.0–100.0)
Monocytes Absolute: 0.7 10*3/uL (ref 0.1–1.0)
Monocytes Relative: 8.1 % (ref 3.0–12.0)
Neutro Abs: 5.1 10*3/uL (ref 1.4–7.7)
Neutrophils Relative %: 59.3 % (ref 43.0–77.0)
Platelets: 249 10*3/uL (ref 150.0–400.0)
RBC: 5.43 Mil/uL (ref 4.22–5.81)
RDW: 14.3 % (ref 11.5–15.5)
WBC: 8.7 10*3/uL (ref 4.0–10.5)

## 2023-03-11 LAB — LIPID PANEL
Cholesterol: 181 mg/dL (ref 0–200)
HDL: 37.9 mg/dL — ABNORMAL LOW (ref >39.00)
LDL Cholesterol: 93 mg/dL (ref 0–99)
NonHDL: 143.51
Total CHOL/HDL Ratio: 5
Triglycerides: 253 mg/dL — ABNORMAL HIGH (ref 0.0–149.0)
VLDL: 50.6 mg/dL — ABNORMAL HIGH (ref 0.0–40.0)

## 2023-03-11 LAB — HEMOGLOBIN A1C: Hgb A1c MFr Bld: 7.9 % — ABNORMAL HIGH (ref 4.6–6.5)

## 2023-03-11 LAB — MICROALBUMIN / CREATININE URINE RATIO
Creatinine,U: 232.5 mg/dL
Microalb Creat Ratio: 7.3 mg/g (ref 0.0–30.0)
Microalb, Ur: 17 mg/dL — ABNORMAL HIGH (ref 0.0–1.9)

## 2023-03-11 LAB — TSH: TSH: 1.59 u[IU]/mL (ref 0.35–5.50)

## 2023-03-19 ENCOUNTER — Encounter: Payer: Federal, State, Local not specified - PPO | Admitting: Family Medicine

## 2023-03-19 ENCOUNTER — Telehealth: Payer: Federal, State, Local not specified - PPO | Admitting: Family Medicine

## 2023-03-19 VITALS — Ht 72.0 in | Wt 210.0 lb

## 2023-03-19 DIAGNOSIS — J069 Acute upper respiratory infection, unspecified: Secondary | ICD-10-CM | POA: Diagnosis not present

## 2023-03-19 DIAGNOSIS — R059 Cough, unspecified: Secondary | ICD-10-CM | POA: Diagnosis not present

## 2023-03-19 LAB — POC COVID19 BINAXNOW: SARS Coronavirus 2 Ag: NEGATIVE

## 2023-03-19 LAB — POCT INFLUENZA A/B
Influenza A, POC: NEGATIVE
Influenza B, POC: NEGATIVE

## 2023-03-19 NOTE — Progress Notes (Unsigned)
Virtual visit completed through caregility or similar program Patient location: home  Provider location: [place] at [facility], office  Participants: Patient and me (unless stated otherwise below)  Limitations and rationale for visit method d/w patient.  Patient agreed to proceed.  Patient identified by 2 identifiers. If vitals are not listed, then patient was unable to self-report due to a lack of equipment at home via telehealth  CC: cough, chills.    HPI: mult sick contacts at work.  Sx started 2 nights ago.  Subjective fevers, aches, chills, cough, sneezing. Acute onset.  Wife with similar now but he developed sx prior to her.  Out of work in the meantime.  Taking dayquil/nyquil.  He can still take a deep breath but it triggers a cough.  No wheeze. Cough is some better now.  He isn't lightheaded. Still taking fluids.  Today is some better than yesterday.    Recent labs d/w pt.   Meds and allergies reviewed.   ROS: Per HPI unless specifically indicated in ROS section   NAD Speech wnl  A/P:  URI sx.  He is going to continue dayquil/nyquil prn.  He can come to clinic and call from the parking lot for a covid and flu swab here at clinic.  See result note.  Supportive care o/w.

## 2023-03-21 NOTE — Assessment & Plan Note (Signed)
URI sx.  He is going to continue dayquil/nyquil prn.  He can come to clinic and call from the parking lot for a covid and flu swab here at clinic.  See result note.  Supportive care o/w.

## 2023-04-16 ENCOUNTER — Other Ambulatory Visit: Payer: Self-pay | Admitting: Family Medicine

## 2023-04-16 NOTE — Telephone Encounter (Signed)
 Pt is due for f/u ov with Dr Para March. Please help him get scheduled. Thanks

## 2023-04-19 NOTE — Telephone Encounter (Signed)
 Spoke to pt, scheduled pt for f/u for 04/30/23

## 2023-04-25 ENCOUNTER — Telehealth: Payer: Self-pay | Admitting: Family Medicine

## 2023-04-25 NOTE — Telephone Encounter (Signed)
 Please update patient- they will get a letter/mychart message about this.    The hospital system discovered a software error in a system at the laboratory at [facility] at [address] in [place] that examines kidney function. The patient's results were incorrectly calculated on this test.  The test is called uACR. It measures the amount of albumin (a protein) in the urine relative to creatinine (a waste product). This test is one of several factors used to judge kidney function. The laboratory software miscalculated the ratio of one to the other. The measurements themselves were correct. The software error has been fixed.  I went back and checked the patient's lab results and medicines.    At this point, it does not appear that the patient needs to make any medicine changes.  We may need to make changes after check his next set of labs.    If the patient has any questions or concerns about this, please schedule a visit/let me know.   Thanks.

## 2023-04-27 NOTE — Telephone Encounter (Signed)
 Unable to reach patient. Left voicemail to return call to our office.

## 2023-04-29 NOTE — Telephone Encounter (Signed)
 Called patient and reviewed all information. Patient verbalized understanding.  Answered all questions and concerns.  Will call if any further questions.

## 2023-04-29 NOTE — Telephone Encounter (Signed)
 Unable to reach patient. Left voicemail to return call to our office.

## 2023-04-30 ENCOUNTER — Ambulatory Visit: Admitting: Family Medicine

## 2023-04-30 ENCOUNTER — Encounter: Payer: Self-pay | Admitting: Family Medicine

## 2023-04-30 VITALS — BP 136/70 | HR 89 | Temp 98.1°F | Ht 72.0 in | Wt 214.8 lb

## 2023-04-30 DIAGNOSIS — Z7189 Other specified counseling: Secondary | ICD-10-CM

## 2023-04-30 DIAGNOSIS — M25511 Pain in right shoulder: Secondary | ICD-10-CM

## 2023-04-30 DIAGNOSIS — Z0000 Encounter for general adult medical examination without abnormal findings: Secondary | ICD-10-CM

## 2023-04-30 DIAGNOSIS — E785 Hyperlipidemia, unspecified: Secondary | ICD-10-CM

## 2023-04-30 DIAGNOSIS — E1129 Type 2 diabetes mellitus with other diabetic kidney complication: Secondary | ICD-10-CM

## 2023-04-30 DIAGNOSIS — M720 Palmar fascial fibromatosis [Dupuytren]: Secondary | ICD-10-CM

## 2023-04-30 DIAGNOSIS — Z1211 Encounter for screening for malignant neoplasm of colon: Secondary | ICD-10-CM

## 2023-04-30 DIAGNOSIS — I10 Essential (primary) hypertension: Secondary | ICD-10-CM

## 2023-04-30 MED ORDER — METFORMIN HCL 1000 MG PO TABS: 1000.0000 mg | ORAL_TABLET | Freq: Every day | ORAL | 3 refills | Status: DC

## 2023-04-30 MED ORDER — LISINOPRIL 20 MG PO TABS: 20.0000 mg | ORAL_TABLET | Freq: Every day | ORAL | 3 refills | Status: DC

## 2023-04-30 MED ORDER — SEMAGLUTIDE (1 MG/DOSE) 4 MG/3ML ~~LOC~~ SOPN: 1.0000 mg | PEN_INJECTOR | SUBCUTANEOUS | 3 refills | Status: DC

## 2023-04-30 MED ORDER — SIMVASTATIN 40 MG PO TABS: 40.0000 mg | ORAL_TABLET | Freq: Every day | ORAL | 3 refills | Status: DC

## 2023-04-30 NOTE — Progress Notes (Signed)
 CPE- See plan.  Routine anticipatory guidance given to patient.  See health maintenance.  The possibility exists that previously documented standard health maintenance information may have been brought forward from a previous encounter into this note.  If needed, that same information has been updated to reflect the current situation based on today's encounter.    Tetanus 2018 Flu d/w pt.  PNA 2015 Shingles d/w pt.   Covid vaccine d/w pt.   Prostate cancer screening and PSA options (with potential risks and benefits of testing vs not testing) were discussed along with recent recs/guidelines.  He declined testing PSA at this point. D/w patient [id number] for colon cancer screening, including IFOB vs. colonoscopy.  Risks and benefits of both were discussed and patient voiced understanding.  Pt elects for: colonoscopy.  Referral placed 2025 Living will d/w pt.   Wife designated if patient were incapacitated.   Diet and exercise d/w pt.  Elevated Cholesterol: Using medications without problems: yes Muscle aches: no Diet compliance: d/w pt.  Exercise: d/w pt.   Hypertension:    Using medication without problems or lightheadedness: yes Chest pain with exertion:no Edema:no Short of breath:no  Diabetes:  Using medications without difficulties: yes Hypoglycemic episodes: no Hyperglycemic episodes:no Feet problems:no Blood Sugars averaging: ~150.   eye exam within last year: yes 0.5mg  ozempic, 80 units insulin. 1000mg  metformin.  20mg  lisinopril.    D/w pt about prev MALB.  His microalbumin ratio is above 20 but below 200 and his GFR is still well above 45.  He needs more help with his A1c and I suspect that increasing Ozempic would provide more benefit for glucose control instead of adding Jardiance.  Discussed.  He is putting up with R shoulder pain.  Still playing pickleball.  D/w pt about ortho eval.    R 5th finger contracture, offered hand clinic eval.    PMH and SH  reviewed  Meds, vitals, and allergies reviewed.   ROS: Per HPI.  Unless specifically indicated otherwise in HPI, the patient denies:  General: fever. Eyes: acute vision changes ENT: sore throat Cardiovascular: chest pain Respiratory: SOB GI: vomiting GU: dysuria Musculoskeletal: acute back pain Derm: acute rash Neuro: acute motor dysfunction Psych: worsening mood Endocrine: polydipsia Heme: bleeding Allergy: hayfever  GEN: nad, alert and oriented HEENT: ncat NECK: supple w/o LA CV: rrr. PULM: ctab, no inc wob ABD: soft, +bs EXT: no edema SKIN: no acute rash R 5th finger contracture noted.   Diabetic foot exam: Normal inspection No skin breakdown No calluses  Normal DP pulses Normal sensation to light touch and monofilament Nails normal

## 2023-04-30 NOTE — Patient Instructions (Signed)
 Increase ozempic to 1mg .  Update me if not tolerated.   Recheck labs in about 3 months at a visit.   Take care.  Glad to see you.

## 2023-05-02 NOTE — Assessment & Plan Note (Addendum)
 Currently taking0.5mg  ozempic, 80 units insulin. 1000mg  metformin.  20mg  lisinopril.    D/w pt about prev MALB.  His microalbumin ratio is above 20 but below 200 and his GFR is still well above 45.  He needs more help with his A1c and I suspect that increasing Ozempic would provide more benefit for glucose control instead of adding Jardiance.  Discussed.  Increase Ozempic to 1 mg with routine cautions and update me if not tolerated.  Recheck in about 3 months.

## 2023-05-02 NOTE — Assessment & Plan Note (Signed)
 He is putting up with R shoulder pain.  Still playing pickleball.  D/w pt about ortho eval.

## 2023-05-02 NOTE — Assessment & Plan Note (Signed)
 Continue work on diet and exercise.  Continue simvastatin.  Previous labs discussed with patient.

## 2023-05-02 NOTE — Assessment & Plan Note (Signed)
 Living will d/w pt.  Wife designated if patient were incapacitated.   ?

## 2023-05-02 NOTE — Assessment & Plan Note (Signed)
 Noted, discussed.  Declined surgical treatment at this point.  Routine cautions given to patient.

## 2023-05-02 NOTE — Assessment & Plan Note (Signed)
 Tetanus 2018 Flu d/w pt.  PNA 2015 Shingles d/w pt.   Covid vaccine d/w pt.   Prostate cancer screening and PSA options (with potential risks and benefits of testing vs not testing) were discussed along with recent recs/guidelines.  He declined testing PSA at this point. D/w patient [id number] for colon cancer screening, including IFOB vs. colonoscopy.  Risks and benefits of both were discussed and patient voiced understanding.  Pt elects for: colonoscopy.  Referral placed 2025 Living will d/w pt.   Wife designated if patient were incapacitated.   Diet and exercise d/w pt.

## 2023-05-02 NOTE — Assessment & Plan Note (Signed)
 Continue work on diet and exercise.  Continue lisinopril.  Previous labs discussed with patient.

## 2023-05-17 ENCOUNTER — Encounter: Payer: Self-pay | Admitting: *Deleted

## 2023-06-10 ENCOUNTER — Ambulatory Visit: Payer: Self-pay

## 2023-06-10 NOTE — Telephone Encounter (Signed)
 Noted. Thanks.

## 2023-06-10 NOTE — Telephone Encounter (Signed)
 Chief Complaint: Abdominal pain  Symptoms: right lower abdominal pain, vomiting, chills  Frequency: Come and go, onset several weeks ago  Pertinent Negatives: Patient denies fever, constipation, bloating, sharp pain, urinary symptoms Disposition: [] ED /[] Urgent Care (no appt availability in office) / [x] Appointment(In office/virtual)/ []  [facility]/ [] Home Care/ [] Refused Recommended Disposition /[] [facility]/ []  Follow-up with PCP Additional Notes: Patient states he has had right sided abdominal pain for several weeks now and yesterday it got worse and he vomited x2. Since vomiting he is feeling better report no pain at the moment but believes he still need to be check out. Care advice was given and patient was offered an office visit today but due to the provider being his neighbor he declined. Patient has been scheduled with PCP tomorrow afternoon. Advised if symptoms get worse seek care at ED. Patient verbalized understanding.  Copied from CRM [id number]. Topic: Clinical - Red Word Triage >> Jun 10, 2023  9:05 AM Harry Whitaker wrote: [facility] that prompted transfer to Nurse Triage: Patient is having pain on abdomen right side Reason for Disposition  [1] MODERATE pain (e.g., interferes with normal activities) AND [2] pain comes and goes (cramps) AND [3] present > 24 hours  (Exception: Pain with Vomiting or Diarrhea - see that Guideline.)  Answer Assessment - Initial Assessment Questions 1. LOCATION: "Where does it hurt?"      Right side lower 2. RADIATION: "Does the pain shoot anywhere else?" (e.g., chest, back)     No 3. ONSET: "When did the pain begin?" (Minutes, hours or days ago)      Weeks ago  4. SUDDEN: "Gradual or sudden onset?"     Gradual  5. PATTERN "Does the pain come and go, or is it constant?"    - If it comes and goes: "How long does it last?" "Do you have pain now?"     (Note: Comes and goes means the pain is intermittent. It goes away completely between  bouts.)    - If constant: "Is it getting better, staying the same, or getting worse?"      (Note: Constant means the pain never goes away completely; most serious pain is constant and gets worse.)      Come and go 6. SEVERITY: "How bad is the pain?"  (e.g., Scale 1-10; mild, moderate, or severe)    - MILD (1-3): Doesn't interfere with normal activities, abdomen soft and not tender to touch.     - MODERATE (4-7): Interferes with normal activities or awakens from sleep, abdomen tender to touch.     - SEVERE (8-10): Excruciating pain, doubled over, unable to do any normal activities.       Mild  7. RECURRENT SYMPTOM: "Have you ever had this type of stomach pain before?" If Yes, ask: "When was the last time?" and "What happened that time?"      No 8. CAUSE: "What do you think is causing the stomach pain?"     Unsure  9. RELIEVING/AGGRAVATING FACTORS: "What makes it better or worse?" (e.g., antacids, bending or twisting motion, bowel movement)     Vomiting made it feel better  10. OTHER SYMPTOMS: "Do you have any other symptoms?" (e.g., back pain, diarrhea, fever, urination pain, vomiting)       Vomiting, chills,  Protocols used: Abdominal Pain - Male-A-AH

## 2023-06-11 ENCOUNTER — Encounter: Payer: Self-pay | Admitting: Family Medicine

## 2023-06-11 ENCOUNTER — Ambulatory Visit: Admitting: Family Medicine

## 2023-06-11 VITALS — BP 116/64 | HR 95 | Temp 98.7°F | Ht 72.0 in | Wt 208.6 lb

## 2023-06-11 DIAGNOSIS — R1110 Vomiting, unspecified: Secondary | ICD-10-CM | POA: Diagnosis not present

## 2023-06-11 MED ORDER — SEMAGLUTIDE (1 MG/DOSE) 4 MG/3ML ~~LOC~~ SOPN: PEN_INJECTOR | SUBCUTANEOUS | Status: DC

## 2023-06-11 NOTE — Progress Notes (Signed)
 Has been taking 1mg  ozempic  and 1000mg  metformin .    Sx started a few weeks ago.  He thought he had a pulled muscle, R sided abd wall discomfort initially.  Then had abd bloating.  More pain 2 days ago.  Vomited at that point, tried to eat, then vomited again.  Out of work yesterday.  Pain improved after vomiting.  Last dose of ozempic  was Monday night.  Still moving his bowels, has BM today.  No known fevers but felt hot/cold after vomiting, then that resolved.  No blood in stools.  No burning with urination.  More burping earlier in the week.    He feels better today.    Meds, vitals, and allergies reviewed.   ROS: Per HPI unless specifically indicated in ROS section   Nad Ncat Neck supple, no LA Rrr Ctab Abd soft, normal BS Lower abd and R lower abd minimally sore.  No rebound.  Skin well-perfused.

## 2023-06-11 NOTE — Patient Instructions (Addendum)
 Go to the lab on the way out.   If you have mychart we'll likely use that to update you.    Take care.  Glad to see you. If you have more/severe pain, then go to the ER.   Stop ozempic  for now.  Update me Monday.   Bland/nonfatty foods in the meantime.

## 2023-06-12 LAB — CBC WITH DIFFERENTIAL/PLATELET
Absolute Lymphocytes: 2724 {cells}/uL (ref 850–3900)
Absolute Monocytes: 753 {cells}/uL (ref 200–950)
Basophils Absolute: 42 {cells}/uL (ref 0–200)
Basophils Relative: 0.4 %
Eosinophils Absolute: 530 {cells}/uL — ABNORMAL HIGH (ref 15–500)
Eosinophils Relative: 5 %
HCT: 45.7 % (ref 38.5–50.0)
Hemoglobin: 15 g/dL (ref 13.2–17.1)
MCH: 25.9 pg — ABNORMAL LOW (ref 27.0–33.0)
MCHC: 32.8 g/dL (ref 32.0–36.0)
MCV: 78.8 fL — ABNORMAL LOW (ref 80.0–100.0)
MPV: 10.7 fL (ref 7.5–12.5)
Monocytes Relative: 7.1 %
Neutro Abs: 6551 {cells}/uL (ref 1500–7800)
Neutrophils Relative %: 61.8 %
Platelets: 320 10*3/uL (ref 140–400)
RBC: 5.8 10*6/uL (ref 4.20–5.80)
RDW: 13.9 % (ref 11.0–15.0)
Total Lymphocyte: 25.7 %
WBC: 10.6 10*3/uL (ref 3.8–10.8)

## 2023-06-12 LAB — COMPREHENSIVE METABOLIC PANEL WITH GFR
AG Ratio: 1.7 (calc) (ref 1.0–2.5)
ALT: 12 U/L (ref 9–46)
AST: 15 U/L (ref 10–35)
Albumin: 4.8 g/dL (ref 3.6–5.1)
Alkaline phosphatase (APISO): 86 U/L (ref 35–144)
BUN: 12 mg/dL (ref 7–25)
CO2: 27 mmol/L (ref 20–32)
Calcium: 9.8 mg/dL (ref 8.6–10.3)
Chloride: 99 mmol/L (ref 98–110)
Creat: 1.1 mg/dL (ref 0.70–1.30)
Globulin: 2.9 g/dL (ref 1.9–3.7)
Glucose, Bld: 127 mg/dL — ABNORMAL HIGH (ref 65–99)
Potassium: 4 mmol/L (ref 3.5–5.3)
Sodium: 137 mmol/L (ref 135–146)
Total Bilirubin: 0.4 mg/dL (ref 0.2–1.2)
Total Protein: 7.7 g/dL (ref 6.1–8.1)
eGFR: 80 mL/min/{1.73_m2} (ref >=60)

## 2023-06-12 LAB — LIPASE: Lipase: 21 U/L (ref 7–60)

## 2023-06-13 ENCOUNTER — Encounter: Payer: Self-pay | Admitting: Family Medicine

## 2023-06-13 DIAGNOSIS — R1110 Vomiting, unspecified: Secondary | ICD-10-CM | POA: Insufficient documentation

## 2023-06-13 NOTE — Assessment & Plan Note (Signed)
 I suspect his GI symptoms are Ozempic  related.  It may be dose-dependent since he tolerated 0.5 mg previously.  It may be that he just had trouble with the 1 mg dose.  He feels better in the meantime.  Okay for outpatient follow-up. Discussed with patient about options. If more/severe pain, then go to the ER.  Stop ozempic  for now.  I asked him to update me Monday.  See notes on labs. Bland/nonfatty foods in the meantime.

## 2023-08-10 ENCOUNTER — Ambulatory Visit: Admitting: Family Medicine

## 2023-08-10 ENCOUNTER — Encounter: Payer: Self-pay | Admitting: Family Medicine

## 2023-08-10 ENCOUNTER — Ambulatory Visit: Payer: Self-pay | Admitting: Family Medicine

## 2023-08-10 VITALS — BP 122/62 | HR 89 | Temp 98.6°F | Ht 72.0 in | Wt 212.4 lb

## 2023-08-10 DIAGNOSIS — R809 Proteinuria, unspecified: Secondary | ICD-10-CM

## 2023-08-10 DIAGNOSIS — Z7984 Long term (current) use of oral hypoglycemic drugs: Secondary | ICD-10-CM | POA: Diagnosis not present

## 2023-08-10 DIAGNOSIS — E1129 Type 2 diabetes mellitus with other diabetic kidney complication: Secondary | ICD-10-CM | POA: Diagnosis not present

## 2023-08-10 DIAGNOSIS — E119 Type 2 diabetes mellitus without complications: Secondary | ICD-10-CM

## 2023-08-10 LAB — POCT GLYCOSYLATED HEMOGLOBIN (HGB A1C): Hemoglobin A1C: 9.9 % — AB (ref 4.0–5.6)

## 2023-08-10 MED ORDER — OZEMPIC (0.25 OR 0.5 MG/DOSE) 2 MG/3ML ~~LOC~~ SOPN: PEN_INJECTOR | SUBCUTANEOUS | 12 refills | Status: DC

## 2023-08-10 NOTE — Patient Instructions (Addendum)
 Restart ozempic  0.25mg  weekly then 0.5mg  after that if tolerated.  If not tolerated, then let me know and stop med.  Recheck in about 3 months.  A1c at the visit.  Take care.  Glad to see you.

## 2023-08-10 NOTE — Progress Notes (Unsigned)
 No vomiting in the meantime.   Prev abd pain resolved.   Sx improved within a few days of the last OV.  He previously stopped Ozempic .  Diabetes:  Using medications without difficulties: yes Hypoglycemic episodes:no Hyperglycemic episodes: see below.   Feet problems:no Blood Sugars averaging: 160-260.   eye exam within last year: yes  Taking 80 units insulin  daily   Metformin  1000mg  every day  Still on simvastatin .    He is off ozempic .  Prev on 1mg  a week.  Prev tolerated 0.5mg  dose.  He doesn't have bloating now.    D/w pt about GI f/u re: colonoscopy.  He can call about that.  Meds, vitals, and allergies reviewed.   ROS: Per HPI unless specifically indicated in ROS section   GEN: nad, alert and oriented HEENT: ncat NECK: supple w/o LA CV: rrr. PULM: ctab, no inc wob ABD: soft, +bs EXT: no edema SKIN:well perfused.  Abd not ttp.

## 2023-08-11 NOTE — Assessment & Plan Note (Signed)
 I suspect that he had a dose related issue with Ozempic . D/w pt about jardiance vs restart ozempic  at lower dose. He may end up needing both, d/w pt.  Routine cautions with both meds d/w pt.  Would continue insulin  and metformin  as is.  Restart Ozempic  0.25 mg weekly for 4 weeks then 0.5 mg weekly thereafter if tolerated.  Recheck periodically.  Update me as needed.  He agrees with plan.  If he tolerates 0.5 mg weekly dose of Ozempic  then I would likely not increase that dose.

## 2023-11-02 ENCOUNTER — Other Ambulatory Visit (HOSPITAL_COMMUNITY): Payer: Self-pay

## 2023-11-02 ENCOUNTER — Encounter: Payer: Self-pay | Admitting: Family Medicine

## 2023-11-02 ENCOUNTER — Ambulatory Visit: Admitting: Family Medicine

## 2023-11-02 VITALS — BP 120/70 | HR 82 | Temp 98.5°F | Wt 217.4 lb

## 2023-11-02 DIAGNOSIS — R809 Proteinuria, unspecified: Secondary | ICD-10-CM | POA: Diagnosis not present

## 2023-11-02 DIAGNOSIS — E1129 Type 2 diabetes mellitus with other diabetic kidney complication: Secondary | ICD-10-CM

## 2023-11-02 DIAGNOSIS — Z23 Encounter for immunization: Secondary | ICD-10-CM

## 2023-11-02 DIAGNOSIS — M720 Palmar fascial fibromatosis [Dupuytren]: Secondary | ICD-10-CM | POA: Diagnosis not present

## 2023-11-02 DIAGNOSIS — Z794 Long term (current) use of insulin: Secondary | ICD-10-CM | POA: Diagnosis not present

## 2023-11-02 DIAGNOSIS — Z7985 Long-term (current) use of injectable non-insulin antidiabetic drugs: Secondary | ICD-10-CM

## 2023-11-02 LAB — POCT GLYCOSYLATED HEMOGLOBIN (HGB A1C)
HbA1c POC (<> result, manual entry): 9.7 % (ref 4.0–5.6)
HbA1c, POC (controlled diabetic range): 9.7 % — AB (ref 0.0–7.0)
HbA1c, POC (prediabetic range): 9.7 % — AB (ref 5.7–6.4)
Hemoglobin A1C: 9.7 % — AB (ref 4.0–5.6)

## 2023-11-02 LAB — MICROALBUMIN / CREATININE URINE RATIO
Creatinine,U: 194.6 mg/dL
Microalb Creat Ratio: 45.3 mg/g — ABNORMAL HIGH (ref 0.0–30.0)
Microalb, Ur: 8.8 mg/dL — ABNORMAL HIGH (ref 0.0–1.9)

## 2023-11-02 MED ORDER — OZEMPIC (0.25 OR 0.5 MG/DOSE) 2 MG/3ML ~~LOC~~ SOPN: PEN_INJECTOR | SUBCUTANEOUS | Status: AC

## 2023-11-02 MED ORDER — EMPAGLIFLOZIN 10 MG PO TABS: 10.0000 mg | ORAL_TABLET | Freq: Every day | ORAL | 3 refills | Status: DC

## 2023-11-02 NOTE — Progress Notes (Unsigned)
 Diabetes:  Using medications without difficulties: Hypoglycemic episodes: no Hyperglycemic episodes:no Feet problems: no Blood Sugars averaging: 200s eye exam within last year: last done at [facility].  Need to request records.   Taking 80 units insulin .   Taking 0.5mg  ozempic . He can tolerate that. R side is sore after playing golf, irritation along the rib. Only noted after playing golf.    D/w pt about GI follow up.  He can call about follow up.  Letter given to patient.   Progressive contracture R5th finger. D/w pt about ortho follow up.  Referral placed.    Meds, vitals, and allergies reviewed.   ROS: Per HPI unless specifically indicated in ROS section   GEN: nad, alert and oriented HEENT: mucous membranes moist NECK: supple w/o LA CV: rrr. PULM: ctab, no inc wob ABD: soft, +bs EXT: no edema SKIN: no acute rash  Diabetic foot exam: Normal inspection No skin breakdown No calluses  Normal DP pulses Normal sensation to light touch and monofilament Nails normal

## 2023-11-02 NOTE — Patient Instructions (Addendum)
 Go to the lab on the way out.   If you have mychart we'll likely use that to update you.    Take care.  Glad to see you. Recheck in about 3 months.   A1c at the visit.   Let me know if you can't tolerate jardiance .

## 2023-11-03 NOTE — Assessment & Plan Note (Signed)
 Taking 80 units insulin .   Taking 0.5mg  ozempic . He can tolerate that. R side is sore after playing golf, irritation along the rib. Only noted after playing golf.  He thought that was not med related. See notes on labs. Add on Jardiance .  Routine cautions discussed with patient. Recheck in about 3 months.   A1c at the visit.

## 2023-11-03 NOTE — Assessment & Plan Note (Signed)
 Discussed, refer to Ortho.

## 2023-11-04 ENCOUNTER — Ambulatory Visit: Payer: Self-pay | Admitting: Family Medicine

## 2023-11-04 ENCOUNTER — Encounter: Payer: Self-pay | Admitting: Family Medicine

## 2023-11-14 ENCOUNTER — Other Ambulatory Visit: Payer: Self-pay | Admitting: Family Medicine

## 2023-11-14 DIAGNOSIS — E119 Type 2 diabetes mellitus without complications: Secondary | ICD-10-CM

## 2023-11-23 ENCOUNTER — Telehealth: Payer: Self-pay | Admitting: Family Medicine

## 2023-11-23 NOTE — Telephone Encounter (Signed)
 Copied from CRM #[id number]. Topic: Referral - Status >> Nov 23, 2023  2:33 PM Franky GRADE wrote: Reason for CRM: Patient is calling to follow up on the referral to Ortho Surgery Dr.Menz, that was placed on 11/02/2023, patient called their office and was informed they had not received anything. Patient would like an update.

## 2023-11-23 NOTE — Telephone Encounter (Signed)
 Referral placed prev.   Please route this to referrals and have them update patient.  Thanks.

## 2023-11-24 NOTE — Telephone Encounter (Signed)
Checking on the status of this referral

## 2023-11-29 NOTE — Telephone Encounter (Signed)
 LVM per dpr advising patient referral has been sent and to contact office directly to schedule.

## 2023-12-14 DIAGNOSIS — M79641 Pain in right hand: Secondary | ICD-10-CM | POA: Diagnosis not present

## 2023-12-14 DIAGNOSIS — M720 Palmar fascial fibromatosis [Dupuytren]: Secondary | ICD-10-CM | POA: Diagnosis not present

## 2023-12-14 DIAGNOSIS — M79642 Pain in left hand: Secondary | ICD-10-CM | POA: Diagnosis not present

## 2023-12-15 ENCOUNTER — Other Ambulatory Visit (HOSPITAL_COMMUNITY): Payer: Self-pay

## 2023-12-16 ENCOUNTER — Other Ambulatory Visit (HOSPITAL_COMMUNITY): Payer: Self-pay

## 2023-12-16 ENCOUNTER — Telehealth: Payer: Self-pay

## 2023-12-16 NOTE — Telephone Encounter (Signed)
 Pharmacy Patient Advocate Encounter   Received notification from Onbase that prior authorization for Ozempic  2 is required/requested.   Insurance verification completed.   The patient is insured through [employer].   Per test claim: PA required; PA submitted to above mentioned insurance via Latent Key/confirmation #/EOC [id number] Status is pending

## 2023-12-16 NOTE — Telephone Encounter (Signed)
 Pharmacy Patient Advocate Encounter  Received notification from [facility] that Prior Authorization for Ozempic  2 has been APPROVED from 12/16/23 to 12/15/24. Unable to obtain price due to refill too soon rejection, last fill date 12/10/23 next available fill date11/14/25   PA #/Case ID/Reference #: # [id number]

## 2024-02-02 ENCOUNTER — Other Ambulatory Visit: Payer: Self-pay | Admitting: Family Medicine

## 2024-02-02 DIAGNOSIS — E785 Hyperlipidemia, unspecified: Secondary | ICD-10-CM

## 2024-02-02 DIAGNOSIS — I10 Essential (primary) hypertension: Secondary | ICD-10-CM

## 2024-02-02 DIAGNOSIS — E119 Type 2 diabetes mellitus without complications: Secondary | ICD-10-CM

## 2024-02-28 ENCOUNTER — Encounter: Payer: Self-pay | Admitting: Family Medicine

## 2024-02-28 ENCOUNTER — Ambulatory Visit: Payer: Self-pay | Admitting: Family Medicine

## 2024-02-28 ENCOUNTER — Ambulatory Visit: Admitting: Family Medicine

## 2024-02-28 VITALS — BP 112/82 | HR 86 | Temp 98.6°F | Ht 72.0 in | Wt 211.5 lb

## 2024-02-28 DIAGNOSIS — E119 Type 2 diabetes mellitus without complications: Secondary | ICD-10-CM

## 2024-02-28 DIAGNOSIS — E1129 Type 2 diabetes mellitus with other diabetic kidney complication: Secondary | ICD-10-CM | POA: Diagnosis not present

## 2024-02-28 DIAGNOSIS — Z794 Long term (current) use of insulin: Secondary | ICD-10-CM

## 2024-02-28 DIAGNOSIS — R809 Proteinuria, unspecified: Secondary | ICD-10-CM | POA: Diagnosis not present

## 2024-02-28 LAB — POCT GLYCOSYLATED HEMOGLOBIN (HGB A1C): Hemoglobin A1C: 8.5 % — AB (ref 4.0–5.6)

## 2024-02-28 MED ORDER — EMPAGLIFLOZIN 25 MG PO TABS: 25.0000 mg | ORAL_TABLET | Freq: Every day | ORAL | 3 refills | Status: AC

## 2024-02-28 NOTE — Progress Notes (Unsigned)
 Diabetes:  Using medications without difficulties: yes Hypoglycemic episodes:no Hyperglycemic episodes:no Feet problems: no Blood Sugars averaging: 170s eye exam within last year: due, d/w pt.   A1c d/w pt at OV.  Taking 80 units insulin  and 10mg  jardiance .  1000mg  metformin .  0.5mg  ozempic .    His father is on Hospice, in [place] .  Condolences offered. He has been travelling to [place]  episodically.    Hand surgery pending.  Needs A1c <8 per patient report.  Fructosamine pending.   A1c 9.9-----> 9.7----->8.5.   D/w pt about inc in jardiance .    D/w pt about colonoscopy, screening.    PMH and SH reviewed  Meds, vitals, and allergies reviewed.   ROS: Per HPI unless specifically indicated in ROS section   GEN: nad, alert and oriented HEENT: mucous membranes moist NECK: supple w/o LA CV: rrr. PULM: ctab, no inc wob ABD: soft, +bs EXT: no edema SKIN: no acute rash, benign appearing SK and skin tag on the back.

## 2024-02-28 NOTE — Patient Instructions (Addendum)
 I would try taking 20mg  jardiance  for now and then if tolerated change to 25mg  tabs.   Take care.  Glad to see you. Go to the lab on the way out.   If you have mychart we'll likely use that to update you.     Plan on recheck in about 3 months. Labs before a yearly visit.

## 2024-03-01 NOTE — Assessment & Plan Note (Addendum)
 A1c d/w pt at OV.  Taking 80 units insulin  and 10mg  jardiance .  1000mg  metformin .  0.5mg  ozempic .    Hand surgery pending.  Needs A1c <8 per patient report.  Fructosamine pending.  See notes on that when that is resulted.  A1c 9.9-----> 9.7----->8.5.   D/w pt about inc in jardiance  to 20 mg for now using the 10 mg tabs and then if tolerated increase to 25 mg.  Plan on recheck in about 3 months. Labs before a yearly visit.

## 2024-03-03 LAB — FRUCTOSAMINE: Fructosamine: 353 umol/L — ABNORMAL HIGH (ref 205–285)

## 2024-05-30 ENCOUNTER — Ambulatory Visit: Admitting: Family Medicine
